# Patient Record
Sex: Female | Born: 1950 | Race: White | Hispanic: No | Marital: Single | Smoking: Never smoker
Health system: Southern US, Community
[De-identification: ages and names within clinical notes are randomized; demographics above are authoritative.]

## PROBLEM LIST (undated history)

## (undated) DIAGNOSIS — I10 Essential (primary) hypertension: Secondary | ICD-10-CM

## (undated) DIAGNOSIS — T7840XA Allergy, unspecified, initial encounter: Secondary | ICD-10-CM

## (undated) DIAGNOSIS — M810 Age-related osteoporosis without current pathological fracture: Secondary | ICD-10-CM

## (undated) HISTORY — DX: Essential (primary) hypertension: I10

## (undated) HISTORY — PX: BUNIONECTOMY: SHX129

## (undated) HISTORY — DX: Allergy, unspecified, initial encounter: T78.40XA

## (undated) HISTORY — PX: TONSILLECTOMY AND ADENOIDECTOMY: SUR1326

## (undated) HISTORY — DX: Age-related osteoporosis without current pathological fracture: M81.0

## (undated) HISTORY — PX: OTHER SURGICAL HISTORY: SHX169

---

## 2018-10-10 ENCOUNTER — Encounter: Payer: Self-pay | Admitting: Family Medicine

## 2018-10-21 ENCOUNTER — Encounter: Payer: Self-pay | Admitting: Family Medicine

## 2018-11-01 ENCOUNTER — Encounter: Payer: Self-pay | Admitting: Family Medicine

## 2018-11-04 ENCOUNTER — Encounter: Payer: Self-pay | Admitting: Family Medicine

## 2018-11-04 ENCOUNTER — Other Ambulatory Visit: Payer: Self-pay

## 2018-11-04 ENCOUNTER — Ambulatory Visit (INDEPENDENT_AMBULATORY_CARE_PROVIDER_SITE_OTHER): Payer: Medicare Other | Admitting: Family Medicine

## 2018-11-04 VITALS — BP 116/72 | HR 67 | Temp 98.3°F | Resp 16 | Ht 65.5 in | Wt 157.0 lb

## 2018-11-04 DIAGNOSIS — Z1239 Encounter for other screening for malignant neoplasm of breast: Secondary | ICD-10-CM

## 2018-11-04 DIAGNOSIS — Z1159 Encounter for screening for other viral diseases: Secondary | ICD-10-CM | POA: Diagnosis not present

## 2018-11-04 DIAGNOSIS — I10 Essential (primary) hypertension: Secondary | ICD-10-CM | POA: Diagnosis not present

## 2018-11-04 DIAGNOSIS — E2839 Other primary ovarian failure: Secondary | ICD-10-CM

## 2018-11-04 HISTORY — DX: Essential (primary) hypertension: I10

## 2018-11-04 LAB — LIPID PANEL
Cholesterol: 190 mg/dL (ref 0–200)
HDL: 39.4 mg/dL (ref >39.00)
LDL Cholesterol: 123 mg/dL — ABNORMAL HIGH (ref 0–99)
NonHDL: 150.68
Total CHOL/HDL Ratio: 5
Triglycerides: 139 mg/dL (ref 0.0–149.0)
VLDL: 27.8 mg/dL (ref 0.0–40.0)

## 2018-11-04 LAB — POCT URINALYSIS DIPSTICK
Bilirubin, UA: NEGATIVE
Blood, UA: NEGATIVE
Glucose, UA: NEGATIVE
Ketones, UA: NEGATIVE
Nitrite, UA: NEGATIVE
Protein, UA: NEGATIVE
Spec Grav, UA: >=1.03 — AB (ref 1.010–1.025)
Urobilinogen, UA: 1 E.U./dL
pH, UA: 5.5 (ref 5.0–8.0)

## 2018-11-04 LAB — COMPREHENSIVE METABOLIC PANEL
ALT: 13 U/L (ref 0–35)
AST: 13 U/L (ref 0–37)
Albumin: 4.3 g/dL (ref 3.5–5.2)
Alkaline Phosphatase: 59 U/L (ref 39–117)
BUN: 13 mg/dL (ref 6–23)
CO2: 28 mEq/L (ref 19–32)
Calcium: 9.7 mg/dL (ref 8.4–10.5)
Chloride: 105 mEq/L (ref 96–112)
Creatinine, Ser: 0.8 mg/dL (ref 0.40–1.20)
GFR: 71.61 mL/min (ref >60.00)
Glucose, Bld: 96 mg/dL (ref 70–99)
Potassium: 4.3 mEq/L (ref 3.5–5.1)
Sodium: 141 mEq/L (ref 135–145)
Total Bilirubin: 0.7 mg/dL (ref 0.2–1.2)
Total Protein: 6.9 g/dL (ref 6.0–8.3)

## 2018-11-04 LAB — CBC WITH DIFFERENTIAL/PLATELET
Basophils Absolute: 0 10*3/uL (ref 0.0–0.1)
Basophils Relative: 0.6 % (ref 0.0–3.0)
Eosinophils Absolute: 0.2 10*3/uL (ref 0.0–0.7)
Eosinophils Relative: 2.6 % (ref 0.0–5.0)
HCT: 41.9 % (ref 36.0–46.0)
Hemoglobin: 13.9 g/dL (ref 12.0–15.0)
Lymphocytes Relative: 34.6 % (ref 12.0–46.0)
Lymphs Abs: 2.5 10*3/uL (ref 0.7–4.0)
MCHC: 33.2 g/dL (ref 30.0–36.0)
MCV: 95.9 fl (ref 78.0–100.0)
Monocytes Absolute: 0.7 10*3/uL (ref 0.1–1.0)
Monocytes Relative: 9.5 % (ref 3.0–12.0)
Neutro Abs: 3.8 10*3/uL (ref 1.4–7.7)
Neutrophils Relative %: 52.7 % (ref 43.0–77.0)
Platelets: 278 10*3/uL (ref 150.0–400.0)
RBC: 4.37 Mil/uL (ref 3.87–5.11)
RDW: 12.8 % (ref 11.5–15.5)
WBC: 7.2 10*3/uL (ref 4.0–10.5)

## 2018-11-04 LAB — TSH: TSH: 3.32 u[IU]/mL (ref 0.35–4.50)

## 2018-11-04 MED ORDER — PREVNAR 13 IM SUSP: 0.5000 mL | Freq: Once | INTRAMUSCULAR | 0 refills | Status: AC

## 2018-11-04 MED ORDER — SHINGRIX 50 MCG/0.5ML IM SUSR: 0.5000 mL | Freq: Once | INTRAMUSCULAR | 0 refills | Status: AC

## 2018-11-04 NOTE — Patient Instructions (Signed)
Please return in 6 months for follow up of your hypertension.  I will release your lab results to you on your MyChart account with further instructions. Please reply with any questions.   Please take the prescriptions to the pharmacy for Shingrix and Prevnar to get vaccinated.   We will call you with information regarding your referral appointment. Mammogram and Bone Density.  If you do not hear from [place] within the next 2 weeks, please let me know. It can take 1-2 weeks to get appointments set up with the specialists.    It was a pleasure meeting you today! Thank you for choosing [place] to meet your healthcare needs! I truly look forward to working with you. If you have any questions or concerns, please send me a message via Mychart or call the office at [phone].   Preventive Care 67 Years and Older, Female Preventive care refers to lifestyle choices and visits with your health care provider that can promote health and wellness. This includes:  A yearly physical exam. This is also called an annual well check.  Regular dental and eye exams.  Immunizations.  Screening for certain conditions.  Healthy lifestyle choices, such as diet and exercise. What can I expect for my preventive care visit? Physical exam Your health care provider will check:  Height and weight. These may be used to calculate body mass index (BMI), which is a measurement that tells if you are at a healthy weight.  Heart rate and blood pressure.  Your skin for abnormal spots. Counseling Your health care provider may ask you questions about:  Alcohol, tobacco, and drug use.  Emotional well-being.  Home and relationship well-being.  Sexual activity.  Eating habits.  History of falls.  Memory and ability to understand (cognition).  Work and work Statistician.  Pregnancy and menstrual history. What immunizations do I need?  Influenza (flu) vaccine  This is recommended every year. Tetanus,  diphtheria, and pertussis (Tdap) vaccine  You may need a Td booster every 10 years. Varicella (chickenpox) vaccine  You may need this vaccine if you have not already been vaccinated. Zoster (shingles) vaccine  You may need this after age 32. Pneumococcal conjugate (PCV13) vaccine  One dose is recommended after age 30. Pneumococcal polysaccharide (PPSV23) vaccine  One dose is recommended after age 7. Measles, mumps, and rubella (MMR) vaccine  You may need at least one dose of MMR if you were born in 1957 or later. You may also need a second dose. Meningococcal conjugate (MenACWY) vaccine  You may need this if you have certain conditions. Hepatitis A vaccine  You may need this if you have certain conditions or if you travel or work in places where you may be exposed to hepatitis A. Hepatitis B vaccine  You may need this if you have certain conditions or if you travel or work in places where you may be exposed to hepatitis B. Haemophilus influenzae type b (Hib) vaccine  You may need this if you have certain conditions. You may receive vaccines as individual doses or as more than one vaccine together in one shot (combination vaccines). Talk with your health care provider about the risks and benefits of combination vaccines. What tests do I need? Blood tests  Lipid and cholesterol levels. These may be checked every 5 years, or more frequently depending on your overall health.  Hepatitis C test.  Hepatitis B test. Screening  Lung cancer screening. You may have this screening every year starting at age 26  if you have a 30-pack-year history of smoking and currently smoke or have quit within the past 15 years.  Colorectal cancer screening. All adults should have this screening starting at age 107 and continuing until age 45. Your health care provider may recommend screening at age 61 if you are at increased risk. You will have tests every 1-10 years, depending on your results and  the type of screening test.  Diabetes screening. This is done by checking your blood sugar (glucose) after you have not eaten for a while (fasting). You may have this done every 1-3 years.  Mammogram. This may be done every 1-2 years. Talk with your health care provider about how often you should have regular mammograms.  BRCA-related cancer screening. This may be done if you have a family history of breast, ovarian, tubal, or peritoneal cancers. Other tests  Sexually transmitted disease (STD) testing.  Bone density scan. This is done to screen for osteoporosis. You may have this done starting at age 74. Follow these instructions at home: Eating and drinking  Eat a diet that includes fresh fruits and vegetables, whole grains, lean protein, and low-fat dairy products. Limit your intake of foods with high amounts of sugar, saturated fats, and salt.  Take vitamin and mineral supplements as recommended by your health care provider.  Do not drink alcohol if your health care provider tells you not to drink.  If you drink alcohol: ? Limit how much you have to 0-1 drink a day. ? Be aware of how much alcohol is in your drink. In the [place] one drink equals one 12 oz bottle of beer (355 mL), one 5 oz glass of wine (148 mL), or one 1 oz glass of hard liquor (44 mL). Lifestyle  Take daily care of your teeth and gums.  Stay active. Exercise for at least 30 minutes on 5 or more days each week.  Do not use any products that contain nicotine or tobacco, such as cigarettes, e-cigarettes, and chewing tobacco. If you need help quitting, ask your health care provider.  If you are sexually active, practice safe sex. Use a condom or other form of protection in order to prevent STIs (sexually transmitted infections).  Talk with your health care provider about taking a low-dose aspirin or statin. What's next?  Go to your health care provider once a year for a well check visit.  Ask your health care  provider how often you should have your eyes and teeth checked.  Stay up to date on all vaccines. This information is not intended to replace advice given to you by your health care provider. Make sure you discuss any questions you have with your health care provider. Document Released: 04/23/2015 Document Revised: 03/21/2018 Document Reviewed: 03/21/2018 Elsevier Patient Education  2020 [employer].

## 2018-11-04 NOTE — Progress Notes (Signed)
Subjective  CC:  Chief Complaint  Patient presents with  . Establish Care    Recent move to the area, PCP was Dr. Sanjuan Dame.   . Hypertension    Takes Amlodipine and Lisinopril, reports no problems    HPI: Stephanie Powell is a 68 y.o. female who presents to [facility] [place] today to establish care with me as a new patient.  Very healthy pleasant divorced 68 yo female who moved here in nov 2019 from [place] area. Doing well. Her sister lives here and they are close. Healthy lifestyle.  She has the following concerns or needs:  Last CPE: > 1 year. Due mammogram and dexa. Due shingrix and pneuomococcal series.   Colon polyp on last colonoscopy; prefers to defer f/u until next year.   HTN for 12 months controlled on low dose amlodipine and ace. Feeling well. Taking medications w/o adverse effects. No symptoms of CHF, angina; no palpitations, sob, cp or lower extremity edema. Compliant with meds.   No other medical problems or concerns.   Assessment  1. Essential hypertension   2. Need for hepatitis C screening test   3. Breast cancer screening   4. Hypoestrogenism      Plan   HTN: doing very well on low dose meds. Prefers to leave meds as are. Continue same.   HM: schedule mammo and dexa. RXs for prevnar and Shingrix given. Education given.   Follow up:  Return in about 6 months (around 05/07/2019) for follow up Hypertension. Orders Placed This Encounter  Procedures  . MM DIGITAL SCREENING BILATERAL  . DG Bone Density  . CBC with Differential/Platelet  . Comprehensive metabolic panel  . Lipid panel  . TSH  . Hepatitis C antibody  . POCT urinalysis dipstick   Meds ordered this encounter  Medications  . Zoster Vaccine Adjuvanted [facility]) injection    Sig: Inject 0.5 mLs into the muscle once for 1 dose. Please give 2nd dose 2-6 months after first dose    Dispense:  2 each    Refill:  0  . pneumococcal 13-valent conjugate vaccine (PREVNAR  13) SUSP injection    Sig: Inject 0.5 mLs into the muscle once for 1 dose.    Dispense:  0.5 mL    Refill:  0     Depression screen [facility] 2/9 11/04/2018  Decreased Interest 0  Down, Depressed, Hopeless 0  PHQ - 2 Score 0    We updated and reviewed the patient's past history in detail and it is documented below.  Patient Active Problem List   Diagnosis Date Noted  . Essential hypertension 11/04/2018   Health Maintenance  Topic Date Due  . Hepatitis C Screening  07/19/1950  . TETANUS/TDAP  02/27/1971  . DEXA SCAN  02/26/2017  . PNA vac Low Risk Adult (1 of 2 - PCV13) 02/26/2017  . MAMMOGRAM  11/08/2017  . INFLUENZA VACCINE  11/09/2018  . COLONOSCOPY  03/09/2024   Immunization History  Administered Date(s) Administered  . Influenza-Unspecified 01/13/2018   Current Meds  Medication Sig  . amLODipine (NORVASC) 5 MG tablet Take 1 tablet by mouth daily.  Marland Kitchen lisinopril (ZESTRIL) 5 MG tablet Take 1 tablet by mouth daily.    Allergies: Patient is allergic to sulfa antibiotics. Past Medical History Patient  has a past medical history of Essential hypertension (11/04/2018) and Hypertension. Past Surgical History Patient  has no past surgical history on file. Family History: Patient family history includes Heart attack  in her father; High Cholesterol in her mother; High blood pressure in her mother, sister, and sister; Stroke in her mother. Social History:  Patient  reports that she has never smoked. She has never used smokeless tobacco. She reports current alcohol use.  Review of Systems: Constitutional: negative for fever or malaise Ophthalmic: negative for photophobia, double vision or loss of vision Cardiovascular: negative for chest pain, dyspnea on exertion, or new LE swelling Respiratory: negative for SOB or persistent cough Gastrointestinal: negative for abdominal pain, change in bowel habits or melena Genitourinary: negative for dysuria or gross hematuria  Musculoskeletal: negative for new gait disturbance or muscular weakness Integumentary: negative for new or persistent rashes Neurological: negative for TIA or stroke symptoms Psychiatric: negative for SI or delusions Allergic/Immunologic: negative for hives  Patient Care Team    Relationship Specialty Notifications Start End  Leamon Arnt, MD PCP - General Family Medicine  11/04/18     Objective  Vitals: BP 116/72   Pulse 67   Temp 98.3 F (36.8 C) (Oral)   Resp 16   Ht 5' 5.5" (1.664 m)   Wt 157 lb (71.2 kg)   SpO2 97%   BMI 25.73 kg/m  General:  Well developed, well nourished, no acute distress  Psych:  Alert and oriented,normal mood and affect HEENT:  Normocephalic, atraumatic, non-icteric sclera, PERRL, oropharynx is without mass or exudate, supple neck without adenopathy, mass or thyromegaly Cardiovascular:  RRR without gallop, rub or murmur, nondisplaced PMI Respiratory:  Good breath sounds bilaterally, CTAB with normal respiratory effort Gastrointestinal: normal bowel sounds, soft, non-tender, no noted masses. No HSM MSK: no deformities, contusions. Joints are without erythema or swelling Skin:  Warm, no rashes or suspicious lesions noted Neurologic:    Mental status is normal. Gross motor and sensory exams are normal. Normal gait Breasts: normal bilaterally. No masses, or adenopathy.   Commons side effects, risks, benefits, and alternatives for medications and treatment plan prescribed today were discussed, and the patient expressed understanding of the given instructions. Patient is instructed to call or message via MyChart if he/she has any questions or concerns regarding our treatment plan. No barriers to understanding were identified. We discussed Red Flag symptoms and signs in detail. Patient expressed understanding regarding what to do in case of urgent or emergency type symptoms.   Medication list was reconciled, printed and provided to the patient in AVS. Patient  instructions and summary information was reviewed with the patient as documented in the AVS. This note was prepared with assistance of Dragon voice recognition software. Occasional wrong-word or sound-a-like substitutions may have occurred due to the inherent limitations of voice recognition software

## 2018-11-05 LAB — HEPATITIS C ANTIBODY
Hepatitis C Ab: NONREACTIVE
SIGNAL TO CUT-OFF: 0.01 (ref <1.00)

## 2018-11-06 ENCOUNTER — Other Ambulatory Visit: Payer: Self-pay | Admitting: Family Medicine

## 2018-11-12 ENCOUNTER — Telehealth: Payer: Self-pay | Admitting: Family Medicine

## 2018-11-12 NOTE — Telephone Encounter (Signed)
See note  Copied from [place] [id number]. Topic: General - Inquiry >> Nov 12, 2018  1:59 PM Virl Axe D wrote: Reason for CRM: Pt called to inquire if office has received her medical records from previous PCP office. She would like to know if records will be able to tell if she has had her pneumonia shot. Requesting call back. Please advise.

## 2018-11-12 NOTE — Telephone Encounter (Signed)
Called and spoke with pt

## 2018-12-30 ENCOUNTER — Other Ambulatory Visit: Payer: Self-pay

## 2018-12-30 ENCOUNTER — Ambulatory Visit
Admission: RE | Admit: 2018-12-30 | Discharge: 2018-12-30 | Disposition: A | Discharge status: Home / Self Care | Payer: Medicare Other | Source: Ambulatory Visit | Attending: Family Medicine | Admitting: Family Medicine

## 2018-12-30 DIAGNOSIS — Z1239 Encounter for other screening for malignant neoplasm of breast: Secondary | ICD-10-CM

## 2019-01-14 ENCOUNTER — Other Ambulatory Visit: Payer: Medicare Other

## 2019-01-14 ENCOUNTER — Ambulatory Visit: Payer: Medicare Other

## 2019-02-10 ENCOUNTER — Encounter: Payer: Self-pay | Admitting: Family Medicine

## 2019-02-10 MED ORDER — LISINOPRIL 5 MG PO TABS: 5.0000 mg | ORAL_TABLET | Freq: Every day | ORAL | 3 refills | Status: DC

## 2019-03-24 ENCOUNTER — Telehealth: Payer: Self-pay | Admitting: Family Medicine

## 2019-03-24 NOTE — Telephone Encounter (Signed)
I left a message asking the patient to call and schedule Medicare AWV with Courtney ([facility]).  If patient calls back, please schedule Medicare Wellness Visit (initial) at next available opening.  VDM (Dee-Dee) 

## 2019-04-11 LAB — HM DEXA SCAN

## 2019-05-06 ENCOUNTER — Other Ambulatory Visit: Payer: Self-pay

## 2019-05-07 ENCOUNTER — Encounter: Payer: Self-pay | Admitting: Family Medicine

## 2019-05-07 ENCOUNTER — Ambulatory Visit (INDEPENDENT_AMBULATORY_CARE_PROVIDER_SITE_OTHER): Payer: Medicare Other | Admitting: Family Medicine

## 2019-05-07 VITALS — BP 122/64 | HR 66 | Temp 97.7°F | Ht 65.5 in | Wt 162.4 lb

## 2019-05-07 DIAGNOSIS — I10 Essential (primary) hypertension: Secondary | ICD-10-CM

## 2019-05-07 NOTE — Progress Notes (Signed)
Subjective  CC:  Chief Complaint  Patient presents with  . Hypertension    taking meds daily none missed. does not check bp at home.     HPI: Stephanie Powell is a 69 y.o. female who presents to the office today to address the problems listed above in the chief complaint.  Hypertension f/u: Control is good . Pt reports she is doing well. taking medications as instructed, no medication side effects noted, no TIAs, no chest pain on exertion, no dyspnea on exertion, no swelling of ankles. She has been on meds for about 2 years but would like to see if she can come off of them.  She denies adverse effects from his BP medications. Compliance with medication is good.   Assessment  1. Essential hypertension      Plan    Hypertension f/u: BP control is well controlled. Will stop lisinopril and follow bp. Discussed appropriate expectations. Recheck if elevating.   HM: will get mammo and dexa in September.  Education regarding management of these chronic disease states was given. Management strategies discussed on successive visits include dietary and exercise recommendations, goals of achieving and maintaining IBW, and lifestyle modifications aiming for adequate sleep and minimizing stressors.   Follow up: July 2021 for cpe and f/u htn  No orders of the defined types were placed in this encounter.  No orders of the defined types were placed in this encounter.     BP Readings from Last 3 Encounters:  05/07/19 122/64  11/04/18 116/72   Wt Readings from Last 3 Encounters:  05/07/19 162 lb 6.4 oz (73.7 kg)  11/04/18 157 lb (71.2 kg)    Lab Results  Component Value Date   CHOL 190 11/04/2018   Lab Results  Component Value Date   HDL 39.40 11/04/2018   Lab Results  Component Value Date   LDLCALC 123 (H) 11/04/2018   Lab Results  Component Value Date   TRIG 139.0 11/04/2018   Lab Results  Component Value Date   CHOLHDL 5 11/04/2018   No results found for:  LDLDIRECT Lab Results  Component Value Date   CREATININE 0.80 11/04/2018   BUN 13 11/04/2018   NA 141 11/04/2018   K 4.3 11/04/2018   CL 105 11/04/2018   CO2 28 11/04/2018    The 10-year ASCVD risk score Mikey Bussing DC Jr., et al., 2013) is: 9.1%   Values used to calculate the score:     Age: 78 years     Sex: Female     Is Non-Hispanic African American: No     Diabetic: No     Tobacco smoker: No     Systolic Blood Pressure: 123XX123 mmHg     Is BP treated: Yes     HDL Cholesterol: 39.4 mg/dL     Total Cholesterol: 190 mg/dL  I reviewed the patients updated PMH, FH, and SocHx.    Patient Active Problem List   Diagnosis Date Noted  . Essential hypertension 11/04/2018    Allergies: Sulfa antibiotics  Social History: Patient  reports that she has never smoked. She has never used smokeless tobacco. She reports current alcohol use.  Current Meds  Medication Sig  . amLODipine (NORVASC) 5 MG tablet TAKE 1 TABLET BY MOUTH EVERY DAY  . [DISCONTINUED] lisinopril (ZESTRIL) 5 MG tablet Take 1 tablet (5 mg total) by mouth daily.    Review of Systems: Cardiovascular: negative for chest pain, palpitations, leg swelling, orthopnea Respiratory: negative for SOB, wheezing  or persistent cough Gastrointestinal: negative for abdominal pain Genitourinary: negative for dysuria or gross hematuria  Objective  Vitals: BP 122/64 (BP Location: Right Arm, Patient Position: Sitting, Cuff Size: Normal)   Pulse 66   Temp 97.7 F (36.5 C) (Temporal)   Ht 5' 5.5" (1.664 m)   Wt 162 lb 6.4 oz (73.7 kg)   SpO2 98%   BMI 26.61 kg/m  General: no acute distress  Psych:  Alert and oriented, normal mood and affect HEENT:  Normocephalic, atraumatic, supple neck  Cardiovascular:  RRR without murmur. no edema Respiratory:  Good breath sounds bilaterally, CTAB with normal respiratory effort   Commons side effects, risks, benefits, and alternatives for medications and treatment plan prescribed today were  discussed, and the patient expressed understanding of the given instructions. Patient is instructed to call or message via MyChart if he/she has any questions or concerns regarding our treatment plan. No barriers to understanding were identified. We discussed Red Flag symptoms and signs in detail. Patient expressed understanding regarding what to do in case of urgent or emergency type symptoms.   Medication list was reconciled, printed and provided to the patient in AVS. Patient instructions and summary information was reviewed with the patient as documented in the AVS. This note was prepared with assistance of Dragon voice recognition software. Occasional wrong-word or sound-a-like substitutions may have occurred due to the inherent limitations of voice recognition software  This visit occurred during the SARS-CoV-2 public health emergency.  Safety protocols were in place, including screening questions prior to the visit, additional usage of staff PPE, and extensive cleaning of exam room while observing appropriate contact time as indicated for disinfecting solutions.

## 2019-05-07 NOTE — Patient Instructions (Signed)
Please return in July 2021 for your annual complete physical; please come fasting.  Stop the lisinopril as instructed and monitor your blood pressure starting in the next 2-3 weeks. Send me a message if it elevated.   If you have any questions or concerns, please don't hesitate to send me a message via MyChart or call the office at [phone]. Thank you for visiting with [place] today! It's our pleasure caring for you.

## 2019-05-09 ENCOUNTER — Ambulatory Visit: Payer: Medicare Other

## 2019-05-13 ENCOUNTER — Encounter: Payer: Self-pay | Admitting: Family Medicine

## 2019-05-17 ENCOUNTER — Ambulatory Visit: Payer: Medicare Other

## 2019-05-26 ENCOUNTER — Ambulatory Visit: Payer: Medicare Other

## 2019-07-18 ENCOUNTER — Encounter: Payer: Self-pay | Admitting: Family Medicine

## 2019-07-27 ENCOUNTER — Other Ambulatory Visit: Payer: Self-pay | Admitting: Family Medicine

## 2019-11-12 ENCOUNTER — Other Ambulatory Visit: Payer: Self-pay

## 2019-11-12 ENCOUNTER — Encounter: Payer: Self-pay | Admitting: Family Medicine

## 2019-11-12 ENCOUNTER — Ambulatory Visit (INDEPENDENT_AMBULATORY_CARE_PROVIDER_SITE_OTHER): Payer: Medicare Other | Admitting: Family Medicine

## 2019-11-12 VITALS — BP 118/86 | HR 70 | Temp 98.0°F | Resp 15 | Ht 66.0 in | Wt 159.8 lb

## 2019-11-12 DIAGNOSIS — I10 Essential (primary) hypertension: Secondary | ICD-10-CM

## 2019-11-12 DIAGNOSIS — K635 Polyp of colon: Secondary | ICD-10-CM

## 2019-11-12 DIAGNOSIS — Z1231 Encounter for screening mammogram for malignant neoplasm of breast: Secondary | ICD-10-CM

## 2019-11-12 DIAGNOSIS — M8580 Other specified disorders of bone density and structure, unspecified site: Secondary | ICD-10-CM

## 2019-11-12 DIAGNOSIS — Z780 Asymptomatic menopausal state: Secondary | ICD-10-CM

## 2019-11-12 HISTORY — DX: Polyp of colon: K63.5

## 2019-11-12 HISTORY — DX: Other specified disorders of bone density and structure, unspecified site: M85.80

## 2019-11-12 HISTORY — DX: Asymptomatic menopausal state: Z78.0

## 2019-11-12 MED ORDER — PREVNAR 13 IM SUSP: 0.5000 mL | Freq: Once | INTRAMUSCULAR | 0 refills | Status: AC

## 2019-11-12 NOTE — Progress Notes (Signed)
Subjective  Chief Complaint  Patient presents with  . Annual Exam    non-fasting    HPI: Stephanie Powell is a 69 y.o. female who presents to [facility] today for a Female Wellness Visit. She also has the concerns and/or needs as listed above in the chief complaint. These will be addressed in addition to the Health Maintenance Visit.   Wellness Visit: annual visit with health maintenance review and exam without Pap   Health maintenance: Mammogram due in September.  Bone density follow-up due in September.  Last done in [place] with a diagnosis of osteopenia, lowest T score equals -1.8.  Patient does not take vitamin supplements but does try to get calcium and vitamin D in her diet.  Eats a healthy diet.  Limited exercise.  Feeling well overall  Immunizations: [facility] called and reported a Pneumovax in August 2020.  Cannot find record of Prevnar but patient thinks she has had it.  She will look into her records. Chronic disease f/u and/or acute problem visit: (deemed necessary to be done in addition to the wellness visit):  History of hypertension: Patient now taking 2.5 mg lisinopril daily and 2.5 mg amlodipine daily.  Home blood pressure readings have been normal.  She would like to come off medications and monitor home blood pressure readings if possible.  No history of coronary artery disease, no chest pain, shortness of breath, lower extremity edema.  Eats a low-salt diet.  At time of diagnosis of hypertension, had high stress lifestyle.  Feels that maybe things have corrected.  She will be willing to restart medications if indicated.  History of colonic polyps by last colonoscopy in 2015.  Due for follow-up.  Patient ready to get records to GI.  Ready for referral.  No melena abdominal pain no weight loss  Assessment  1. Essential hypertension   2. Osteopenia after menopause   3. Polyp of colon, unspecified part of colon, unspecified type   4. Encounter  for screening mammogram for breast cancer   5. Asymptomatic menopausal state       Plan  Female Wellness Visit:  Age appropriate Health Maintenance and Prevention measures were discussed with patient. Included topics are cancer screening recommendations, ways to keep healthy (see AVS) including dietary and exercise recommendations, regular eye and dental care, use of seat belts, and avoidance of moderate alcohol use and tobacco use.   BMI: discussed patient's BMI and encouraged positive lifestyle modifications to help get to or maintain a target BMI.  HM needs and immunizations were addressed and ordered. See below for orders. See HM and immunization section for updates.  Prescription for Prevnar given to be gotten at pharmacy if needed  Routine labs and screening tests ordered including cmp, cbc and lipids where appropriate.  Discussed recommendations regarding Vit D and calcium supplementation (see AVS)  Chronic disease management visit and/or acute problem visit:  Essential hypertension: Good control on very low dose ACE and amlodipine.  Stop both medications.  Monitor home blood pressure readings over the next month daily.  Send in readings to me.  If elevated, will restart one medication.  Osteopenia: Follow-up bone density ordered.  Patient to try to get calcium and vitamin D by diet.  See after visit summary for instructions.  She would like to avoid any oral supplements.  Colon polyps: Refer for colonoscopy  Follow up: Return in about 6 months (around 05/14/2020) for follow up Hypertension.  Orders Placed This Encounter    Procedures  . MM DIGITAL SCREENING BILATERAL  . DG Bone Density  . CBC with Differential/Platelet  . Comp Met (CMET)  . Lipid Profile  . Ambulatory referral to Gastroenterology   Meds ordered this encounter  Medications  . pneumococcal 13-valent conjugate vaccine (PREVNAR 13) SUSP injection    Sig: Inject 0.5 mLs into the muscle once for 1 dose.     Dispense:  0.5 mL    Refill:  0      Lifestyle: Body mass index is 25.79 kg/m. Wt Readings from Last 3 Encounters:  11/12/19 159 lb 12.8 oz (72.5 kg)  05/07/19 162 lb 6.4 oz (73.7 kg)  11/04/18 157 lb (71.2 kg)    Patient Active Problem List   Diagnosis Date Noted  . Osteopenia after menopause 11/12/2019    [facility] DEXA 2020: T = -1.8 lowest   . Colon polyps 11/12/2019    Colonoscopy, [place]: 2015   . Essential hypertension 11/04/2018   Health Maintenance  Topic Date Due  . INFLUENZA VACCINE  11/09/2019  . TETANUS/TDAP  05/06/2020 (Originally 02/27/1970)  . PNA vac Low Risk Adult (2 of 2 - PCV13) 12/04/2019  . MAMMOGRAM  12/30/2019  . DEXA SCAN  04/10/2021  . COLONOSCOPY  03/09/2024  . COVID-19 Vaccine  Completed  . Hepatitis C Screening  Completed   Immunization History  Administered Date(s) Administered  . Fluad Quad(high Dose 65+) 12/04/2018  . Influenza-Unspecified 01/13/2018  . Moderna SARS-COVID-2 Vaccination 05/13/2019, 06/12/2019  . Pneumococcal Polysaccharide-23 12/04/2018   We updated and reviewed the patient's past history in detail and it is documented below. Allergies: Patient is allergic to sulfa antibiotics. Past Medical History Patient  has a past medical history of Colon polyps (11/12/2019), Essential hypertension (11/04/2018), Hypertension, and Osteopenia after menopause (11/12/2019). Past Surgical History Patient  has no past surgical history on file. Family History: Patient family history includes Heart attack in her father; High Cholesterol in her mother; High blood pressure in her mother, sister, and sister; Stroke in her mother. Social History:  Patient  reports that she has never smoked. She has never used smokeless tobacco. She reports current alcohol use.  Review of Systems: Constitutional: negative for fever or malaise Ophthalmic: negative for photophobia, double vision or loss of vision Cardiovascular: negative for chest pain, dyspnea on  exertion, or new LE swelling Respiratory: negative for SOB or persistent cough Gastrointestinal: negative for abdominal pain, change in bowel habits or melena Genitourinary: negative for dysuria or gross hematuria, no abnormal uterine bleeding or disharge Musculoskeletal: negative for new gait disturbance or muscular weakness Integumentary: negative for new or persistent rashes, no breast lumps Neurological: negative for TIA or stroke symptoms Psychiatric: negative for SI or delusions Allergic/Immunologic: negative for hives  Patient Care Team    Relationship Specialty Notifications Start End  Leamon Arnt, MD PCP - General Family Medicine  11/04/18     Objective  Vitals: BP 118/86   Pulse 70   Temp 98 F (36.7 C) (Temporal)   Resp 15   Ht 5' 6" (1.676 m)   Wt 159 lb 12.8 oz (72.5 kg)   SpO2 98%   BMI 25.79 kg/m  General:  Well developed, well nourished, no acute distress  Psych:  Alert and orientedx3,normal mood and affect HEENT:  Normocephalic, atraumatic, non-icteric sclera,  supple neck without adenopathy, mass or thyromegaly Cardiovascular:  Normal S1, S2, RRR without gallop, rub or murmur Respiratory:  Good breath sounds bilaterally, CTAB with normal respiratory effort Gastrointestinal: normal bowel  sounds, soft, non-tender, no noted masses. No HSM MSK: no deformities, contusions. Joints are without erythema or swelling.  Skin:  Warm, no rashes or suspicious lesions noted Neurologic:    Mental status is normal. CN 2-11 are normal. Gross motor and sensory exams are normal. Normal gait. No tremor Breast Exam: No mass, skin retraction or nipple discharge is appreciated in either breast. No axillary adenopathy. Fibrocystic changes are not noted    Commons side effects, risks, benefits, and alternatives for medications and treatment plan prescribed today were discussed, and the patient expressed understanding of the given instructions. Patient is instructed to call or  message via MyChart if he/she has any questions or concerns regarding our treatment plan. No barriers to understanding were identified. We discussed Red Flag symptoms and signs in detail. Patient expressed understanding regarding what to do in case of urgent or emergency type symptoms.   Medication list was reconciled, printed and provided to the patient in AVS. Patient instructions and summary information was reviewed with the patient as documented in the AVS. This note was prepared with assistance of Dragon voice recognition software. Occasional wrong-word or sound-a-like substitutions may have occurred due to the inherent limitations of voice recognition software  This visit occurred during the SARS-CoV-2 public health emergency.  Safety protocols were in place, including screening questions prior to the visit, additional usage of staff PPE, and extensive cleaning of exam room while observing appropriate contact time as indicated for disinfecting solutions.     

## 2019-11-12 NOTE — Patient Instructions (Addendum)
Please return in 6 months for hypertension follow up.  I will release your lab results to you on your MyChart account with further instructions. Please reply with any questions.   Please take the prescription for Shingrix and Prevnar to the pharmacy so they may administer the vaccinations. Your insurance will then cover the injections.   If you have any questions or concerns, please don't hesitate to send me a message via MyChart or call the office at [phone]. Thank you for visiting with [place] today! It's our pleasure caring for you.  I have ordered a mammogram and/or bone density for you as we discussed today: both are due in September [x]   Mammogram  [x]   Bone Density  Please call the office checked below to schedule your appointment: Your appointment will at the following location  [x]   [facility] [facility] [place]      [place], [place]         []   [facility]  [place], [place]  Make sure to wear two peace clothing  No lotions powders or deodorants the day of the appointment Make sure to bring picture ID and insurance card.  Bring list of medications you are currently taking including any supplements.    Calcium Intake Recommendations You can take Caltrate Plus twice a day or get it through your diet or other OTC supplements (Viactiv, OsCal etc)  Calcium is a mineral that affects many functions in the body, including:  Blood clotting.  Blood vessel function.  Nerve impulse conduction.  Hormone secretion.  Muscle contraction.  Bone and teeth functions.  Most of your body's calcium supply is stored in your bones and teeth. When your calcium stores are low, you may be at risk for low bone mass, bone loss, and bone fractures. Consuming enough calcium helps to grow healthy bones and teeth and to prevent breakdown over time. It is very important that you get enough calcium if  you are:  A child undergoing rapid growth.  An adolescent girl.  A pre- or post-menopausal woman.  A woman whose menstrual cycle has stopped due to anorexia nervosa or regular intense exercise.  An individual with lactose intolerance or a milk allergy.  A vegetarian.  What is my plan? Try to consume the recommended amount of calcium daily based on your age. Depending on your overall health, your health care provider may recommend increased calcium intake.General daily calcium intake recommendations by age are:  Birth to 6 months: 200 mg.  Infants 7 to 12 months: 260 mg.  Children 1 to 3 years: 700 mg.  Children 4 to 8 years: 1,000 mg.  Children 9 to 13 years: 1,300 mg.  Teens 14 to 18 years: 1,300 mg.  Adults 19 to 50 years: 1,000 mg.  Adult women 51 to 70 years: 1,200 mg.  Adult men 51 to 70 years: 1,000 mg.  Adults 71 years and older: 1,200 mg.  Pregnant and breastfeeding teens: 1,300 mg.  Pregnant and breastfeeding adults: 1,000 mg.  What do I need to know about calcium intake?  In order for the body to absorb calcium, it needs vitamin D. You can get vitamin D through (we recommend getting [phone] units of Vitamin D daily) ? Direct exposure of the skin to sunlight. ? Foods, such as egg yolks, liver, saltwater fish, and fortified milk. ?  Supplements.  Consuming too much calcium may cause: ? Constipation. ? Decreased absorption of iron and zinc. ? Kidney stones.  Calcium supplements may interact with certain medicines. Check with your health care provider before starting any calcium supplements.  Try to get most of your calcium from food. What foods can I eat? Grains  Fortified oatmeal. Fortified ready-to-eat cereals. Fortified frozen waffles. Vegetables Turnip greens. Broccoli. Fruits Fortified orange juice. Meats and Other Protein Sources Canned sardines with bones. Canned salmon with bones. Soy beans. Tofu. Baked beans. Almonds. [place] nuts.  Sunflower seeds. Dairy Milk. Yogurt. Cheese. Cottage cheese. Beverages Fortified soy milk. Fortified rice milk. Sweets/Desserts Pudding. Ice Cream. Milkshakes. Blackstrap molasses. The items listed above may not be a complete list of recommended foods or beverages. Contact your dietitian for more options. What foods can affect my calcium intake? It may be more difficult for your body to use calcium or calcium may leave your body more quickly if you consume large amounts of:  Sodium.  Protein.  Caffeine.  Alcohol.  This information is not intended to replace advice given to you by your health care provider. Make sure you discuss any questions you have with your health care provider. Document Released: 11/09/2003 Document Revised: 10/15/2015 Document Reviewed: 09/02/2013 Elsevier Interactive Patient Education  2018 [employer].

## 2019-11-13 LAB — COMPREHENSIVE METABOLIC PANEL
AG Ratio: 1.4 (calc) (ref 1.0–2.5)
ALT: 21 U/L (ref 6–29)
AST: 18 U/L (ref 10–35)
Albumin: 3.9 g/dL (ref 3.6–5.1)
Alkaline phosphatase (APISO): 56 U/L (ref 37–153)
BUN: 16 mg/dL (ref 7–25)
CO2: 27 mmol/L (ref 20–32)
Calcium: 9.6 mg/dL (ref 8.6–10.4)
Chloride: 104 mmol/L (ref 98–110)
Creat: 0.76 mg/dL (ref 0.50–0.99)
Globulin: 2.8 g/dL (calc) (ref 1.9–3.7)
Glucose, Bld: 100 mg/dL — ABNORMAL HIGH (ref 65–99)
Potassium: 4.3 mmol/L (ref 3.5–5.3)
Sodium: 139 mmol/L (ref 135–146)
Total Bilirubin: 0.6 mg/dL (ref 0.2–1.2)
Total Protein: 6.7 g/dL (ref 6.1–8.1)

## 2019-11-13 LAB — LIPID PANEL
Cholesterol: 228 mg/dL — ABNORMAL HIGH (ref <200)
HDL: 40 mg/dL — ABNORMAL LOW (ref >=50)
LDL Cholesterol (Calc): 154 mg/dL (calc) — ABNORMAL HIGH
Non-HDL Cholesterol (Calc): 188 mg/dL (calc) — ABNORMAL HIGH (ref <130)
Total CHOL/HDL Ratio: 5.7 (calc) — ABNORMAL HIGH (ref <5.0)
Triglycerides: 204 mg/dL — ABNORMAL HIGH (ref <150)

## 2019-11-13 LAB — CBC WITH DIFFERENTIAL/PLATELET
Absolute Monocytes: 582 cells/uL (ref 200–950)
Basophils Absolute: 51 cells/uL (ref 0–200)
Basophils Relative: 0.8 %
Eosinophils Absolute: 211 cells/uL (ref 15–500)
Eosinophils Relative: 3.3 %
HCT: 40.5 % (ref 35.0–45.0)
Hemoglobin: 13.4 g/dL (ref 11.7–15.5)
Lymphs Abs: 2266 cells/uL (ref 850–3900)
MCH: 31.9 pg (ref 27.0–33.0)
MCHC: 33.1 g/dL (ref 32.0–36.0)
MCV: 96.4 fL (ref 80.0–100.0)
MPV: 10.7 fL (ref 7.5–12.5)
Monocytes Relative: 9.1 %
Neutro Abs: 3290 cells/uL (ref 1500–7800)
Neutrophils Relative %: 51.4 %
Platelets: 320 10*3/uL (ref 140–400)
RBC: 4.2 10*6/uL (ref 3.80–5.10)
RDW: 12.2 % (ref 11.0–15.0)
Total Lymphocyte: 35.4 %
WBC: 6.4 10*3/uL (ref 3.8–10.8)

## 2019-11-14 ENCOUNTER — Telehealth: Payer: Self-pay | Admitting: Gastroenterology

## 2019-11-14 NOTE — Telephone Encounter (Signed)
Patient brought in colonoscopy/path report. Dr. Bryan Lemma is doc of the day for 08/04/21am. Records placed on his desk for review.

## 2019-11-20 NOTE — Telephone Encounter (Signed)
Left message for patient to callback to schedule a colonoscopy.

## 2019-11-20 NOTE — Telephone Encounter (Signed)
Records received and reviewed and for the following: -Colonoscopy (Dr. Tama Gander, [facility], 03/02/2014): 2 mm tubular adenoma.  Sigmoid diverticulosis, internal hemorrhoids.  Recommended repeat in 5 years  Based on the finding of a single tubular adenoma, recommendations by the previous Endoscopist, and guideline recommendations at that time, I agree with scheduling repeat colonoscopy now for ongoing polyp surveillance.  Okay to schedule with me.  Thank you.

## 2019-11-21 ENCOUNTER — Encounter: Payer: Self-pay | Admitting: Gastroenterology

## 2019-11-21 NOTE — Telephone Encounter (Signed)
Colon scheduled on 10/1 at 9:30am at [facility].

## 2019-12-16 ENCOUNTER — Encounter: Payer: Self-pay | Admitting: Family Medicine

## 2019-12-23 ENCOUNTER — Ambulatory Visit (AMBULATORY_SURGERY_CENTER): Payer: Self-pay

## 2019-12-23 ENCOUNTER — Other Ambulatory Visit: Payer: Self-pay

## 2019-12-23 VITALS — Ht 66.0 in | Wt 158.0 lb

## 2019-12-23 DIAGNOSIS — Z86010 Personal history of colonic polyps: Secondary | ICD-10-CM

## 2019-12-23 DIAGNOSIS — Z1211 Encounter for screening for malignant neoplasm of colon: Secondary | ICD-10-CM

## 2019-12-23 NOTE — Progress Notes (Signed)
No allergies to soy or egg Pt is not on blood thinners or diet pills Denies issues with sedation/intubation Denies atrial flutter/fib Denies constipation    Pt is aware of Covid safety and care partner requirements.  Patient has an issue with a care partner.  Wants to proceed with PV, but may decide to cancel. Pt was reassured of [facility] protocol and that she can opt to be a 'HIPAA' designated pt. She voiced understanding.

## 2020-01-09 ENCOUNTER — Other Ambulatory Visit: Payer: Self-pay

## 2020-01-09 ENCOUNTER — Ambulatory Visit (AMBULATORY_SURGERY_CENTER): Payer: Medicare Other | Admitting: Gastroenterology

## 2020-01-09 ENCOUNTER — Encounter: Payer: Self-pay | Admitting: Gastroenterology

## 2020-01-09 VITALS — BP 122/98 | HR 62 | Temp 96.9°F | Resp 13 | Ht 66.0 in | Wt 158.0 lb

## 2020-01-09 DIAGNOSIS — K5730 Diverticulosis of large intestine without perforation or abscess without bleeding: Secondary | ICD-10-CM

## 2020-01-09 DIAGNOSIS — D122 Benign neoplasm of ascending colon: Secondary | ICD-10-CM

## 2020-01-09 DIAGNOSIS — K635 Polyp of colon: Secondary | ICD-10-CM

## 2020-01-09 DIAGNOSIS — D123 Benign neoplasm of transverse colon: Secondary | ICD-10-CM

## 2020-01-09 DIAGNOSIS — D120 Benign neoplasm of cecum: Secondary | ICD-10-CM

## 2020-01-09 DIAGNOSIS — Z86010 Personal history of colonic polyps: Secondary | ICD-10-CM | POA: Diagnosis present

## 2020-01-09 DIAGNOSIS — D1801 Hemangioma of skin and subcutaneous tissue: Secondary | ICD-10-CM | POA: Diagnosis not present

## 2020-01-09 DIAGNOSIS — K641 Second degree hemorrhoids: Secondary | ICD-10-CM

## 2020-01-09 MED ORDER — SODIUM CHLORIDE 0.9 % IV SOLN: 500.0000 mL | Freq: Once | INTRAVENOUS | Status: DC

## 2020-01-09 NOTE — Patient Instructions (Signed)
Handouts given:  Polyps, Hemorrhoids, Diverticulosis Resume previous diet Continue current medications Await pathology results Start using supplemental fiber like:  Citrucel, Fibercon, Konsyl or Metamucil Repeat colonoscopy in 3-5 years  YOU HAD AN ENDOSCOPIC PROCEDURE TODAY AT [facility]:   Refer to the procedure report that was given to you for any specific questions about what was found during the examination.  If the procedure report does not answer your questions, please call your gastroenterologist to clarify.  If you requested that your care partner not be given the details of your procedure findings, then the procedure report has been included in a sealed envelope for you to review at your convenience later.  YOU SHOULD EXPECT: Some feelings of bloating in the abdomen. Passage of more gas than usual.  Walking can help get rid of the air that was put into your GI tract during the procedure and reduce the bloating. If you had a lower endoscopy (such as a colonoscopy or flexible sigmoidoscopy) you may notice spotting of blood in your stool or on the toilet paper. If you underwent a bowel prep for your procedure, you may not have a normal bowel movement for a few days.  Please Note:  You might notice some irritation and congestion in your nose or some drainage.  This is from the oxygen used during your procedure.  There is no need for concern and it should clear up in a day or so.  SYMPTOMS TO REPORT IMMEDIATELY:   Following lower endoscopy (colonoscopy or flexible sigmoidoscopy):  Excessive amounts of blood in the stool  Significant tenderness or worsening of abdominal pains  Swelling of the abdomen that is new, acute  Fever of 100F or higher   For urgent or emergent issues, a gastroenterologist can be reached at any hour by calling [phone]. Do not use MyChart messaging for urgent concerns.    DIET:  We do recommend a small meal at first, but then you may  proceed to your regular diet.  Drink plenty of fluids but you should avoid alcoholic beverages for 24 hours.  ACTIVITY:  You should plan to take it easy for the rest of today and you should NOT DRIVE or use heavy machinery until tomorrow (because of the sedation medicines used during the test).    FOLLOW UP: Our staff will call the number listed on your records 48-72 hours following your procedure to check on you and address any questions or concerns that you may have regarding the information given to you following your procedure. If we do not reach you, we will leave a message.  We will attempt to reach you two times.  During this call, we will ask if you have developed any symptoms of COVID 19. If you develop any symptoms (ie: fever, flu-like symptoms, shortness of breath, cough etc.) before then, please call [phone].  If you test positive for Covid 19 in the 2 weeks post procedure, please call and report this information to [place].    If any biopsies were taken you will be contacted by phone or by letter within the next 1-3 weeks.  Please call us at [phone] if you have not heard about the biopsies in 3 weeks.    SIGNATURES/CONFIDENTIALITY: You and/or your care partner have signed paperwork which will be entered into your electronic medical record.  These signatures attest to the fact that that the information above on your After Visit Summary has been reviewed and is understood.  Full  responsibility of the confidentiality of this discharge information lies with you and/or your care-partner.

## 2020-01-09 NOTE — Progress Notes (Signed)
A and O x3. Report to RN. Tolerated MAC anesthesia well. 

## 2020-01-09 NOTE — Progress Notes (Signed)
VS by SP    Pt's states no medical or surgical changes since previsit or office visit.  

## 2020-01-09 NOTE — Op Note (Signed)
[place] Patient Name: Stephanie Powell Procedure Date: 01/09/2020 10:10 AM MRN: [id number] Endoscopist: Gerrit Heck , MD Age: 69 Referring MD:  Date of Birth: [birth year] Gender: Female Account #: [id number] Procedure:                Colonoscopy Indications:              Surveillance: Personal history of adenomatous                            polyps on last colonoscopy > 5 years ago                           Colonoscopy in 02/2014 at outside facility notable                            for 2 mm tubular adenoma, with recommendation to                            repeat in 5 years. Medicines:                Monitored Anesthesia Care Procedure:                Pre-Anesthesia Assessment:                           - Prior to the procedure, a History and Physical                            was performed, and patient medications and                            allergies were reviewed. The patient's tolerance of                            previous anesthesia was also reviewed. The risks                            and benefits of the procedure and the sedation                            options and risks were discussed with the patient.                            All questions were answered, and informed consent                            was obtained. Prior Anticoagulants: The patient has                            taken no previous anticoagulant or antiplatelet                            agents. ASA Grade Assessment: II - A patient with  mild systemic disease. After reviewing the risks                            and benefits, the patient was deemed in                            satisfactory condition to undergo the procedure.                           After obtaining informed consent, the colonoscope                            was passed under direct vision. Throughout the                            procedure, the patient's blood pressure, pulse, and                             oxygen saturations were monitored continuously. The                            Colonoscope was introduced through the anus and                            advanced to the the terminal ileum. The colonoscopy                            was performed without difficulty. The patient                            tolerated the procedure well. The quality of the                            bowel preparation was good. The terminal ileum,                            ileocecal valve, appendiceal orifice, and rectum                            were photographed. Scope In: 10:22:41 AM Scope Out: 10:44:17 AM Scope Withdrawal Time: 0 hours 17 minutes 22 seconds  Total Procedure Duration: 0 hours 21 minutes 36 seconds  Findings:                 The perianal and digital rectal examinations were                            normal.                           Three sessile polyps were found in the ascending                            colon (1) and cecum (2). The polyps were 3 to 5 mm  in size. These polyps were removed with a cold                            snare. Resection and retrieval were complete.                            Estimated blood loss was minimal.                           A 8 mm polyp was found in the transverse colon. The                            polyp was sessile. The polyp was removed with a                            cold snare. Resection and retrieval were complete.                            Estimated blood loss was minimal.                           Multiple small and large-mouthed diverticula were                            found in the sigmoid colon.                           Non-bleeding internal hemorrhoids were found during                            retroflexion. The hemorrhoids were small and Grade                            II (internal hemorrhoids that prolapse but reduce                            spontaneously).                            The terminal ileum appeared normal. Complications:            No immediate complications. Estimated Blood Loss:     Estimated blood loss was minimal. Impression:               - Three 3 to 5 mm polyps in the ascending colon and                            in the cecum, removed with a cold snare. Resected                            and retrieved.                           - One 8 mm polyp in the transverse colon, removed  with a cold snare. Resected and retrieved.                           - Diverticulosis in the sigmoid colon.                           - Non-bleeding internal hemorrhoids.                           - The examined portion of the ileum was normal. Recommendation:           - Patient has a contact number available for                            emergencies. The signs and symptoms of potential                            delayed complications were discussed with the                            patient. Return to normal activities tomorrow.                            Written discharge instructions were provided to the                            patient.                           - Resume previous diet.                           - Continue present medications.                           - Await pathology results.                           - Repeat colonoscopy in 3 - 5 years for                            surveillance based on pathology results.                           - Return to GI office PRN.                           - Use fiber, for example Citrucel, Fibercon, Konsyl                            or Metamucil.                           - Internal hemorrhoids were noted on this study and                            may  be amenable to hemorrhoid band ligation. If you                            are interested in further treatment of these                            hemorrhoids with band ligation, please contact my                             clinic to set up an appointment for evaluation and                            treatment. Gerrit Heck, MD 01/09/2020 10:49:11 AM

## 2020-01-09 NOTE — Progress Notes (Signed)
Called to room to assist during endoscopic procedure.  Patient ID and intended procedure confirmed with present staff. Received instructions for my participation in the procedure from the performing physician.  

## 2020-01-13 ENCOUNTER — Telehealth: Payer: Self-pay

## 2020-01-13 NOTE — Telephone Encounter (Signed)
  Follow up Call-  Call back number 01/09/2020  Post procedure Call Back phone  # [phone]  Permission to leave phone message Yes     Patient questions:  Do you have a fever, pain , or abdominal swelling? No. Pain Score  0 *  Have you tolerated food without any problems? Yes.    Have you been able to return to your normal activities? Yes.    Do you have any questions about your discharge instructions: Diet   No. Medications  No. Follow up visit  No.  Do you have questions or concerns about your Care? No.  Actions: * If pain score is 4 or above: 1. No action needed, pain <4.Have you developed a fever since your procedure? no  2.   Have you had an respiratory symptoms (SOB or cough) since your procedure? no  3.   Have you tested positive for COVID 19 since your procedure no  4.   Have you had any family members/close contacts diagnosed with the COVID 19 since your procedure?  no   If yes to any of these questions please route to Joylene John, RN and Joella Prince, RN

## 2020-01-19 ENCOUNTER — Other Ambulatory Visit: Payer: Self-pay

## 2020-01-19 ENCOUNTER — Ambulatory Visit
Admission: RE | Admit: 2020-01-19 | Discharge: 2020-01-19 | Disposition: A | Discharge status: Home / Self Care | Payer: Medicare Other | Source: Ambulatory Visit | Attending: Family Medicine | Admitting: Family Medicine

## 2020-01-19 ENCOUNTER — Encounter: Payer: Self-pay | Admitting: Gastroenterology

## 2020-01-19 DIAGNOSIS — Z1231 Encounter for screening mammogram for malignant neoplasm of breast: Secondary | ICD-10-CM

## 2020-01-19 DIAGNOSIS — Z780 Asymptomatic menopausal state: Secondary | ICD-10-CM

## 2020-01-19 DIAGNOSIS — M8580 Other specified disorders of bone density and structure, unspecified site: Secondary | ICD-10-CM

## 2020-01-20 ENCOUNTER — Ambulatory Visit: Payer: Medicare Other

## 2020-01-23 ENCOUNTER — Other Ambulatory Visit: Payer: Self-pay | Admitting: Family Medicine

## 2020-01-23 DIAGNOSIS — R928 Other abnormal and inconclusive findings on diagnostic imaging of breast: Secondary | ICD-10-CM

## 2020-02-06 ENCOUNTER — Ambulatory Visit
Admission: RE | Admit: 2020-02-06 | Discharge: 2020-02-06 | Disposition: A | Discharge status: Home / Self Care | Payer: Medicare Other | Source: Ambulatory Visit | Attending: Family Medicine | Admitting: Family Medicine

## 2020-02-06 ENCOUNTER — Other Ambulatory Visit: Payer: Self-pay

## 2020-02-06 DIAGNOSIS — R928 Other abnormal and inconclusive findings on diagnostic imaging of breast: Secondary | ICD-10-CM

## 2020-07-25 ENCOUNTER — Other Ambulatory Visit: Payer: Self-pay | Admitting: Family Medicine

## 2021-02-14 ENCOUNTER — Other Ambulatory Visit: Payer: Self-pay | Admitting: Family Medicine

## 2021-02-14 DIAGNOSIS — Z1231 Encounter for screening mammogram for malignant neoplasm of breast: Secondary | ICD-10-CM

## 2021-02-20 IMAGING — MG MM DIGITAL DIAGNOSTIC UNILAT*L* W/ TOMO W/ CAD
4 series · 4 of 12 positions shown · non-contrast
Comparison: Previous exam(s).

CLINICAL DATA: Screening recall for a possible left breast mass.

EXAM:
DIGITAL DIAGNOSTIC LEFT MAMMOGRAM WITH CAD AND TOMO
ULTRASOUND LEFT BREAST

[L CC synth-2D]
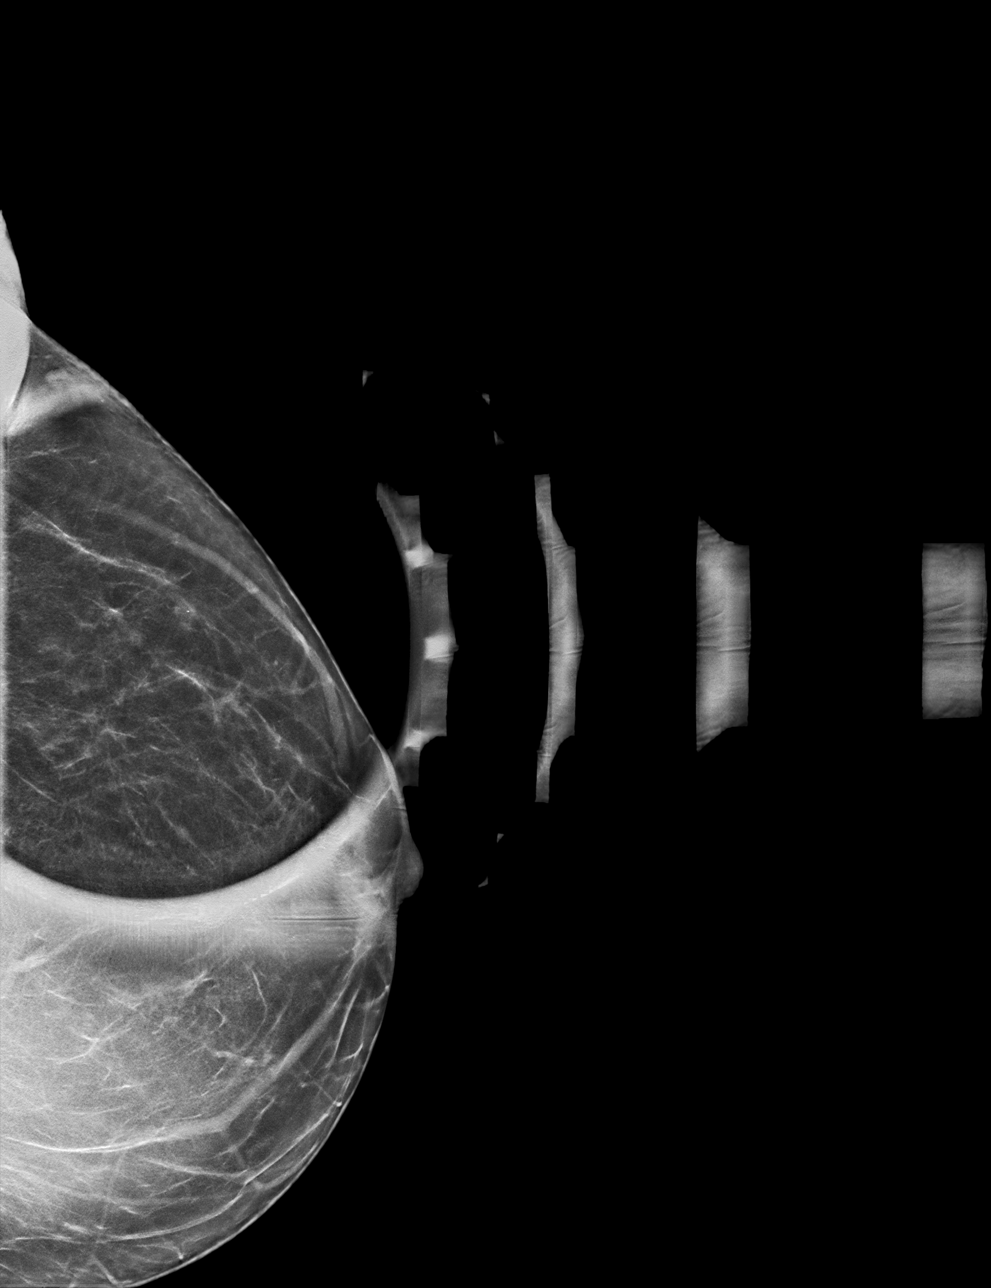

[L MLO synth-2D]
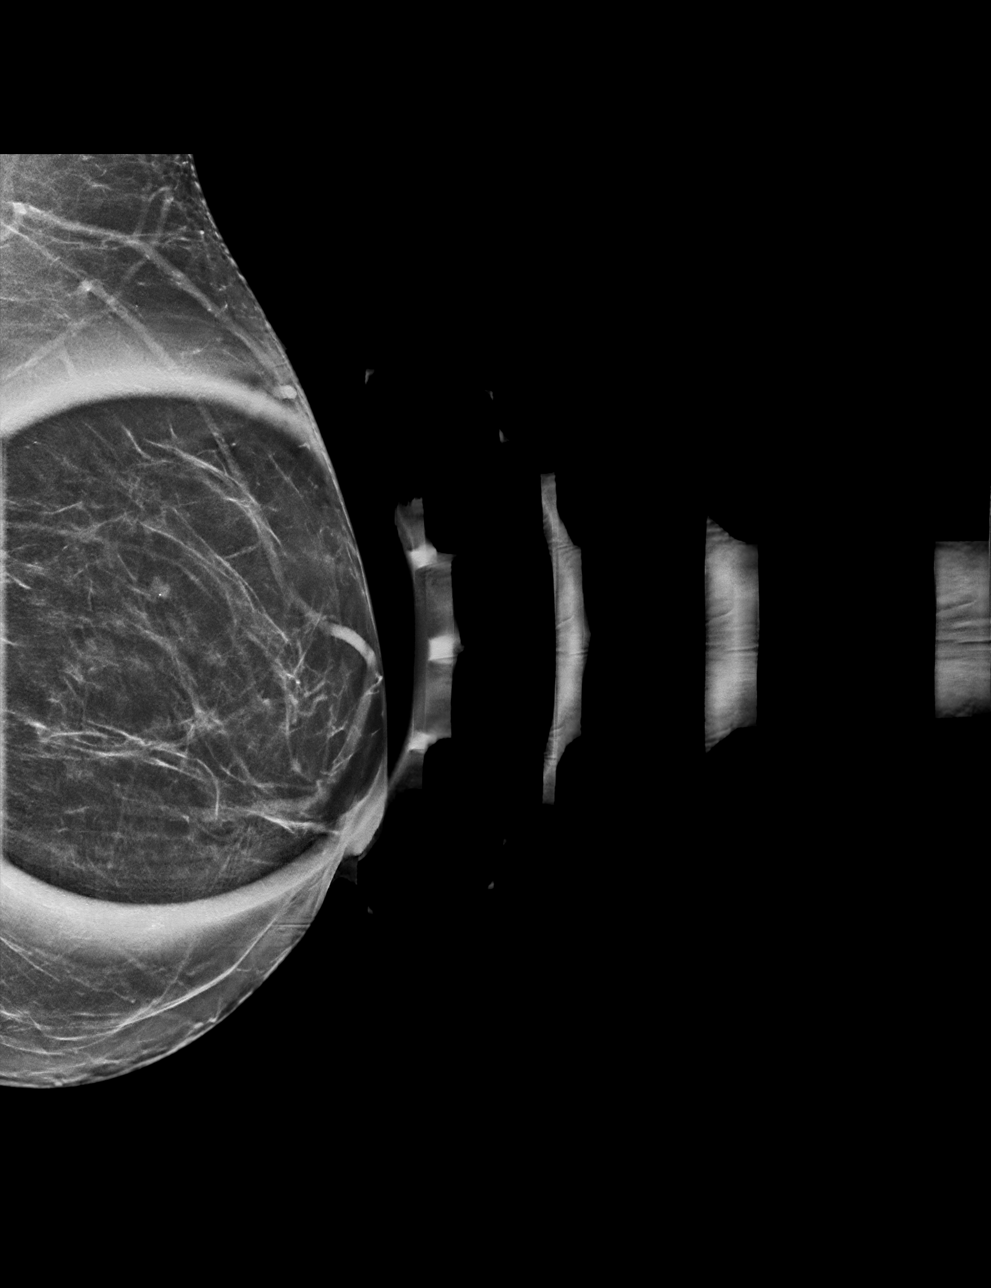

[L CC tomo · tomo slice 27/54.0]
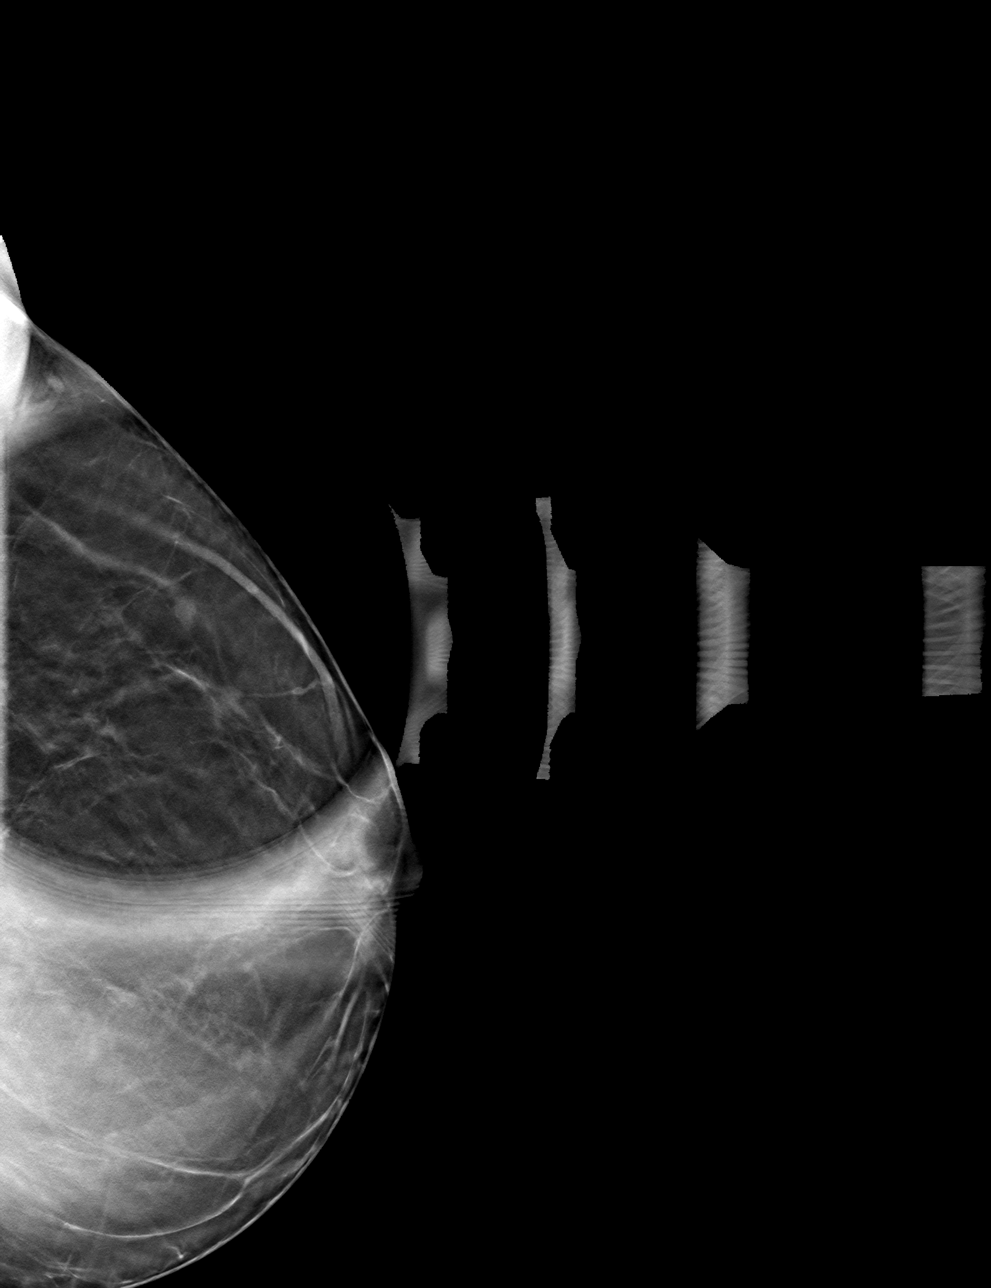

[L MLO tomo · tomo slice 31/62.0]
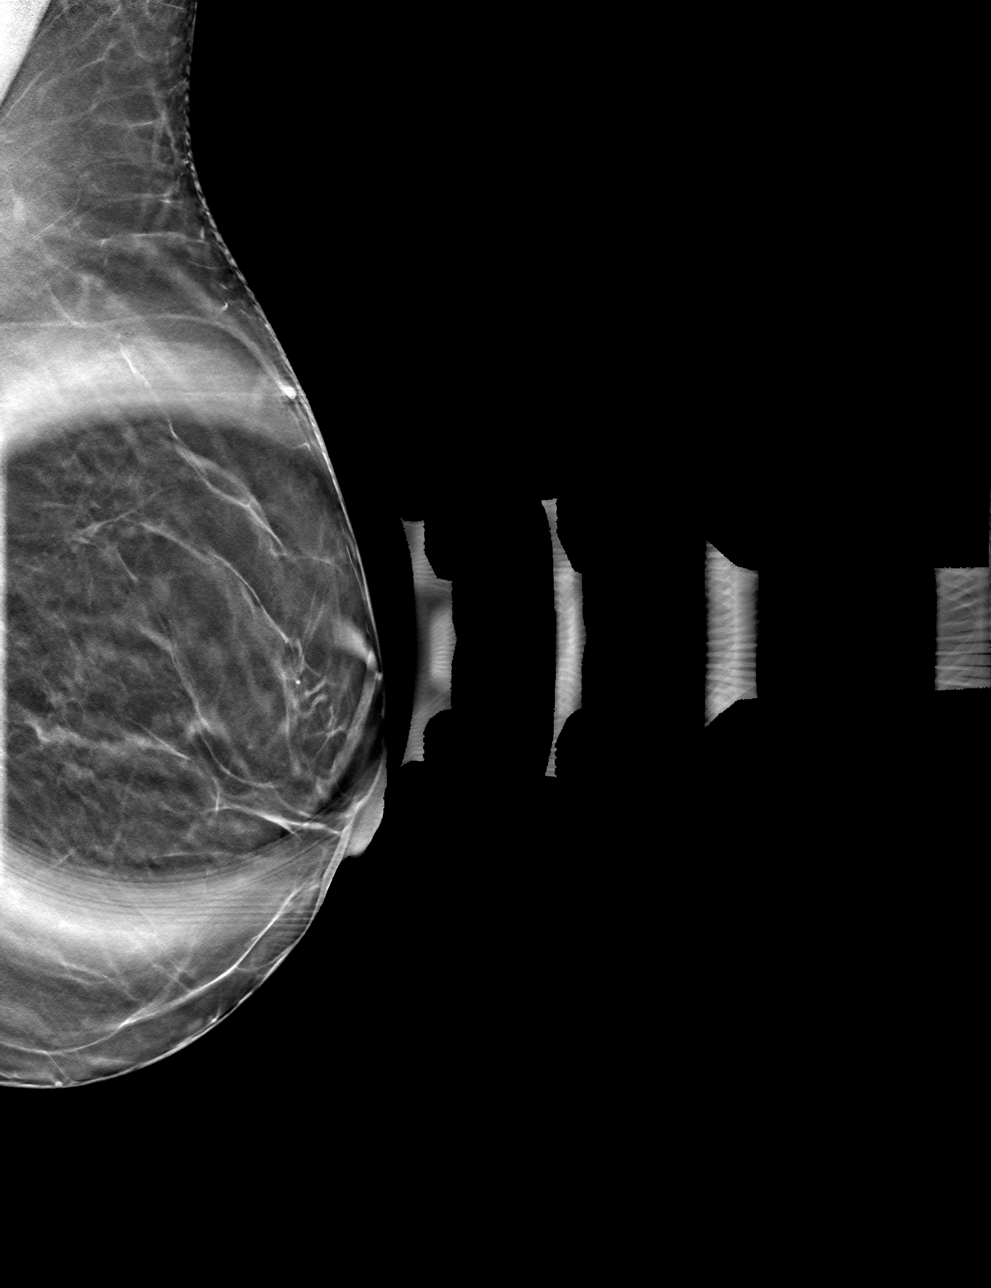

[4 of 12 positions shown; findings below may reference images not displayed]

ACR Breast Density Category b: There are scattered areas of
fibroglandular density.
FINDINGS: Possible mass noted in the lateral left breast, persists as small, 3
mm, circumscribed mass with a single round calcification along its
inferior/medial wall.

There are no other suspicious masses, no areas of architectural
distortion and no suspicious calcifications.

Mammographic images were processed with CAD.

Targeted ultrasound is performed, showing an oval cyst in the left
breast at 3 o'clock, 3 cm the nipple, measuring 3 mm in size,
containing a single echogenic focus along its dependent wall
consistent with a calcification. This corresponds in size, shape and
location to the mammographic mass.
IMPRESSION: 1. No evidence of breast malignancy.
2. Small benign cyst in the lateral left breast.

RECOMMENDATION:
Screening mammogram in one year.(Code:[id number])

I have discussed the findings and recommendations with the patient.
If applicable, a reminder letter will be sent to the patient
regarding the next appointment.

BI-RADS CATEGORY  2: Benign.

## 2021-02-28 ENCOUNTER — Ambulatory Visit
Admission: RE | Admit: 2021-02-28 | Discharge: 2021-02-28 | Disposition: A | Discharge status: Home / Self Care | Payer: Medicare Other | Source: Ambulatory Visit

## 2021-02-28 ENCOUNTER — Other Ambulatory Visit: Payer: Self-pay

## 2021-02-28 DIAGNOSIS — Z1231 Encounter for screening mammogram for malignant neoplasm of breast: Secondary | ICD-10-CM

## 2021-05-25 ENCOUNTER — Other Ambulatory Visit: Payer: Self-pay

## 2021-05-25 ENCOUNTER — Ambulatory Visit (INDEPENDENT_AMBULATORY_CARE_PROVIDER_SITE_OTHER): Payer: Medicare Other | Admitting: Family Medicine

## 2021-05-25 ENCOUNTER — Encounter: Payer: Self-pay | Admitting: Family Medicine

## 2021-05-25 VITALS — BP 142/96 | HR 82 | Temp 98.2°F | Ht 65.0 in | Wt 166.2 lb

## 2021-05-25 DIAGNOSIS — M8580 Other specified disorders of bone density and structure, unspecified site: Secondary | ICD-10-CM | POA: Diagnosis not present

## 2021-05-25 DIAGNOSIS — I10 Essential (primary) hypertension: Secondary | ICD-10-CM | POA: Diagnosis not present

## 2021-05-25 DIAGNOSIS — K635 Polyp of colon: Secondary | ICD-10-CM

## 2021-05-25 DIAGNOSIS — Z780 Asymptomatic menopausal state: Secondary | ICD-10-CM

## 2021-05-25 MED ORDER — LISINOPRIL 5 MG PO TABS: 5.0000 mg | ORAL_TABLET | Freq: Every day | ORAL | 3 refills | Status: DC

## 2021-05-25 NOTE — Progress Notes (Signed)
Subjective  Chief Complaint  Patient presents with   Annual Exam    HPI: Stephanie Powell is a 71 y.o. female who presents to [facility] [place] today for a Female Wellness Visit. She also has the concerns and/or needs as listed above in the chief complaint. These will be addressed in addition to the Health Maintenance Visit.   Wellness Visit: annual visit with health maintenance review and exam without Pap  Health maintenance: Screens are current.  Reviewed colonoscopy from last year that found a serrated polyp.  Will have repeat in 3 years.  Bone density showed stable osteopenia.  Repeat in 3 years.  She continues to be very active, joined a hiking group.  Happy and healthy.  Chronic disease f/u and/or acute problem visit: (deemed necessary to be done in addition to the wellness visit): Hypertension: She stopped her medications and then restarted lisinopril 2.5 mg daily.  She did run out of that and then started amlodipine 2.5 mg daily.  She has not checked her home blood pressures.  Blood pressure is elevated here today.  She feels well. Osteopenia: Continue calcium and vitamin D.  Hikes.  Understands how to prevent osteoporosis.  Assessment  1. Essential hypertension   2. Serrated polyp of colon   3. Osteopenia after menopause      Plan  Female Wellness Visit: Age appropriate Health Maintenance and Prevention measures were discussed with patient. Included topics are cancer screening recommendations, ways to keep healthy (see AVS) including dietary and exercise recommendations, regular eye and dental care, use of seat belts, and avoidance of moderate alcohol use and tobacco use.  BMI: discussed patient's BMI and encouraged positive lifestyle modifications to help get to or maintain a target BMI. HM needs and immunizations were addressed and ordered. See below for orders. See HM and immunization section for updates. Routine labs and screening tests ordered  including cmp, cbc and lipids where appropriate. Discussed recommendations regarding Vit D and calcium supplementation (see AVS)  Chronic disease management visit and/or acute problem visit: Hypertension: Discussed goal.  Recommend restarting lisinopril at 5 mg daily.  Restart home monitoring.  Keep a log.  Recheck here in 3 to 6 months.  Discussed goal of 120s over 70s consistently.  Low-salt diet.  Mild weight loss.  Continue activity.  Check renal function, electrolytes and lipid panel today.  Check CBC. Osteopenia: Stable: Recheck at year 3.  Continue vitamin D, calcium and weightbearing activity like hiking. Serrated colon polyp: Precancerous.  She will follow-up as directed  Follow up: 3 to 6 months for blood pressure recheck Orders Placed This Encounter  Procedures   CBC with Differential/Platelet   Comprehensive metabolic panel   Lipid panel   TSH   No orders of the defined types were placed in this encounter.     Body mass index is 27.66 kg/m. Wt Readings from Last 3 Encounters:  05/25/21 166 lb 3.2 oz (75.4 kg)  01/09/20 158 lb (71.7 kg)  12/23/19 158 lb (71.7 kg)     Patient Active Problem List   Diagnosis Date Noted   Osteopenia after menopause 11/12/2019    [facility] DEXA 2020: T = -1.8 lowest DEXA 01/2020: T = -1.7; stable osteopenia. Recheck 2-3 years    Serrated polyp of colon 11/12/2019    Colonoscopy, [place]: 2015 Colonoscopy [place]: 2022, precancerous. Repeat in 3 years    Essential hypertension 11/04/2018   Health Maintenance  Topic Date Due   Zoster Vaccines- Shingrix (  1 of 2) Never done   MAMMOGRAM  02/28/2022   COLONOSCOPY (Pts 45-73yrs Insurance coverage will need to be confirmed)  01/09/2023   DEXA SCAN  01/19/2023   Pneumonia Vaccine 44+ Years old  Completed   INFLUENZA VACCINE  Completed   COVID-19 Vaccine  Completed   Hepatitis C Screening  Completed   HPV VACCINES  Aged Out   TETANUS/TDAP  Discontinued   Immunization History   Administered Date(s) Administered   Fluad Quad(high Dose 65+) 12/04/2018, 01/27/2021   Influenza-Unspecified 01/13/2018   Moderna Covid-19 Vaccine Bivalent Booster 49yrs & up 01/27/2021   Moderna Sars-Covid-2 Vaccination 05/13/2019, 06/12/2019   Pneumococcal Conjugate-13 09/05/2017   Pneumococcal Polysaccharide-23 12/04/2018   We updated and reviewed the patient's past history in detail and it is documented below. Allergies: Patient is allergic to sulfa antibiotics. Past Medical History Patient  has a past medical history of Allergy, Colon polyps (11/12/2019), Essential hypertension (11/04/2018), Hypertension, Osteopenia after menopause (11/12/2019), and Osteoporosis. Past Surgical History Patient  has a past surgical history that includes Knee orthosopy; Bunionectomy; and Tonsillectomy and adenoidectomy. Family History: Patient family history includes Colon polyps in her sister; Heart attack in her father; High Cholesterol in her mother; High blood pressure in her mother, sister, and sister; Stroke in her mother. Social History:  Patient  reports that she has never smoked. She has never used smokeless tobacco. She reports current alcohol use. She reports that she does not use drugs.  Review of Systems: Constitutional: negative for fever or malaise Ophthalmic: negative for photophobia, double vision or loss of vision Cardiovascular: negative for chest pain, dyspnea on exertion, or new LE swelling Respiratory: negative for SOB or persistent cough Gastrointestinal: negative for abdominal pain, change in bowel habits or melena Genitourinary: negative for dysuria or gross hematuria, no abnormal uterine bleeding or disharge Musculoskeletal: negative for new gait disturbance or muscular weakness Integumentary: negative for new or persistent rashes, no breast lumps Neurological: negative for TIA or stroke symptoms Psychiatric: negative for SI or delusions Allergic/Immunologic: negative for  hives  Patient Care Team    Relationship Specialty Notifications Start End  Leamon Arnt, MD PCP - General Family Medicine  11/04/18   Lavena Bullion, DO Consulting Physician Gastroenterology  05/25/21     Objective  Vitals: BP (!) 142/96    Pulse 82    Temp 98.2 F (36.8 C) (Temporal)    Ht 5\' 5"  (1.651 m)    Wt 166 lb 3.2 oz (75.4 kg)    SpO2 96%    BMI 27.66 kg/m  General:  Well developed, well nourished, no acute distress  Psych:  Alert and orientedx3,normal mood and affect HEENT:  Normocephalic, atraumatic, non-icteric sclera,  supple neck without adenopathy, mass or thyromegaly Cardiovascular:  Normal S1, S2, RRR without gallop, rub or murmur Respiratory:  Good breath sounds bilaterally, CTAB with normal respiratory effort Gastrointestinal: normal bowel sounds, soft, non-tender, no noted masses. No HSM MSK: no deformities, contusions. Joints are without erythema or swelling.  Skin:  Warm, no rashes or suspicious lesions noted Neurologic:    Mental status is normal. CN 2-11 are normal. Gross motor and sensory exams are normal. Normal gait. No tremor   Commons side effects, risks, benefits, and alternatives for medications and treatment plan prescribed today were discussed, and the patient expressed understanding of the given instructions. Patient is instructed to call or message via MyChart if he/she has any questions or concerns regarding our treatment plan. No barriers to understanding were  identified. We discussed Red Flag symptoms and signs in detail. Patient expressed understanding regarding what to do in case of urgent or emergency type symptoms.  Medication list was reconciled, printed and provided to the patient in AVS. Patient instructions and summary information was reviewed with the patient as documented in the AVS. This note was prepared with assistance of Dragon voice recognition software. Occasional wrong-word or sound-a-like substitutions may have occurred due to the  inherent limitations of voice recognition software  This visit occurred during the SARS-CoV-2 public health emergency.  Safety protocols were in place, including screening questions prior to the visit, additional usage of staff PPE, and extensive cleaning of exam room while observing appropriate contact time as indicated for disinfecting solutions.

## 2021-05-25 NOTE — Patient Instructions (Signed)
Please return in 3-6 months to recheck blood pressure.   I will release your lab results to you on your MyChart account with further instructions. You may see the results before I do, but when I review them I will send you a message with my report or have my assistant call you if things need to be discussed. Please reply to my message with any questions. Thank you!   We want your blood pressure to be 120s/70s or less. Let's restart the lisinopril 5mg  daily as we discussed.  Enjoy your hiking!  If you have any questions or concerns, please don't hesitate to send me a message via MyChart or call the office at [phone]. Thank you for visiting with [place] today! It's our pleasure caring for you.   Hypertension, Adult High blood pressure (hypertension) is when the force of blood pumping through the arteries is too strong. The arteries are the blood vessels that carry blood from the heart throughout the body. Hypertension forces the heart to work harder to pump blood and may cause arteries to become narrow or stiff. Untreated or uncontrolled hypertension can cause a heart attack, heart failure, a stroke, kidney disease, and other problems. A blood pressure reading consists of a higher number over a lower number. Ideally, your blood pressure should be below 120/80. The first ("top") number is called the systolic pressure. It is a measure of the pressure in your arteries as your heart beats. The second ("bottom") number is called the diastolic pressure. It is a measure of the pressure in your arteries as the heart relaxes. What are the causes? The exact cause of this condition is not known. There are some conditions that result in or are related to high blood pressure. What increases the risk? Some risk factors for high blood pressure are under your control. The following factors may make you more likely to develop this condition: Smoking. Having type 2 diabetes mellitus, high cholesterol, or both. Not  getting enough exercise or physical activity. Being overweight. Having too much fat, sugar, calories, or salt (sodium) in your diet. Drinking too much alcohol. Some risk factors for high blood pressure may be difficult or impossible to change. Some of these factors include: Having chronic kidney disease. Having a family history of high blood pressure. Age. Risk increases with age. Race. You may be at higher risk if you are African American. Gender. Men are at higher risk than women before age 42. After age 21, women are at higher risk than men. Having obstructive sleep apnea. Stress. What are the signs or symptoms? High blood pressure may not cause symptoms. Very high blood pressure (hypertensive crisis) may cause: Headache. Anxiety. Shortness of breath. Nosebleed. Nausea and vomiting. Vision changes. Severe chest pain. Seizures. How is this diagnosed? This condition is diagnosed by measuring your blood pressure while you are seated, with your arm resting on a flat surface, your legs uncrossed, and your feet flat on the floor. The cuff of the blood pressure monitor will be placed directly against the skin of your upper arm at the level of your heart. It should be measured at least twice using the same arm. Certain conditions can cause a difference in blood pressure between your right and left arms. Certain factors can cause blood pressure readings to be lower or higher than normal for a short period of time: When your blood pressure is higher when you are in a health care provider's office than when you are at home, this is  called white coat hypertension. Most people with this condition do not need medicines. When your blood pressure is higher at home than when you are in a health care provider's office, this is called masked hypertension. Most people with this condition may need medicines to control blood pressure. If you have a high blood pressure reading during one visit or you have  normal blood pressure with other risk factors, you may be asked to: Return on a different day to have your blood pressure checked again. Monitor your blood pressure at home for 1 week or longer. If you are diagnosed with hypertension, you may have other blood or imaging tests to help your health care provider understand your overall risk for other conditions. How is this treated? This condition is treated by making healthy lifestyle changes, such as eating healthy foods, exercising more, and reducing your alcohol intake. Your health care provider may prescribe medicine if lifestyle changes are not enough to get your blood pressure under control, and if: Your systolic blood pressure is above 130. Your diastolic blood pressure is above 80. Your personal target blood pressure may vary depending on your medical conditions, your age, and other factors. Follow these instructions at home: Eating and drinking  Eat a diet that is high in fiber and potassium, and low in sodium, added sugar, and fat. An example eating plan is called the DASH (Dietary Approaches to Stop Hypertension) diet. To eat this way: Eat plenty of fresh fruits and vegetables. Try to fill one half of your plate at each meal with fruits and vegetables. Eat whole grains, such as whole-wheat pasta, brown rice, or whole-grain bread. Fill about one fourth of your plate with whole grains. Eat or drink low-fat dairy products, such as skim milk or low-fat yogurt. Avoid fatty cuts of meat, processed or cured meats, and poultry with skin. Fill about one fourth of your plate with lean proteins, such as fish, chicken without skin, beans, eggs, or tofu. Avoid pre-made and processed foods. These tend to be higher in sodium, added sugar, and fat. Reduce your daily sodium intake. Most people with hypertension should eat less than 1,500 mg of sodium a day. Do not drink alcohol if: Your health care provider tells you not to drink. You are pregnant, may  be pregnant, or are planning to become pregnant. If you drink alcohol: Limit how much you use to: 0-1 drink a day for women. 0-2 drinks a day for men. Be aware of how much alcohol is in your drink. In the [place] one drink equals one 12 oz bottle of beer (355 mL), one 5 oz glass of wine (148 mL), or one 1 oz glass of hard liquor (44 mL). Lifestyle  Work with your health care provider to maintain a healthy body weight or to lose weight. Ask what an ideal weight is for you. Get at least 30 minutes of exercise most days of the week. Activities may include walking, swimming, or biking. Include exercise to strengthen your muscles (resistance exercise), such as Pilates or lifting weights, as part of your weekly exercise routine. Try to do these types of exercises for 30 minutes at least 3 days a week. Do not use any products that contain nicotine or tobacco, such as cigarettes, e-cigarettes, and chewing tobacco. If you need help quitting, ask your health care provider. Monitor your blood pressure at home as told by your health care provider. Keep all follow-up visits as told by your health care provider. This is important.  Medicines Take over-the-counter and prescription medicines only as told by your health care provider. Follow directions carefully. Blood pressure medicines must be taken as prescribed. Do not skip doses of blood pressure medicine. Doing this puts you at risk for problems and can make the medicine less effective. Ask your health care provider about side effects or reactions to medicines that you should watch for. Contact a health care provider if you: Think you are having a reaction to a medicine you are taking. Have headaches that keep coming back (recurring). Feel dizzy. Have swelling in your ankles. Have trouble with your vision. Get help right away if you: Develop a severe headache or confusion. Have unusual weakness or numbness. Feel faint. Have severe pain in your chest  or abdomen. Vomit repeatedly. Have trouble breathing. Summary Hypertension is when the force of blood pumping through your arteries is too strong. If this condition is not controlled, it may put you at risk for serious complications. Your personal target blood pressure may vary depending on your medical conditions, your age, and other factors. For most people, a normal blood pressure is less than 120/80. Hypertension is treated with lifestyle changes, medicines, or a combination of both. Lifestyle changes include losing weight, eating a healthy, low-sodium diet, exercising more, and limiting alcohol. This information is not intended to replace advice given to you by your health care provider. Make sure you discuss any questions you have with your health care provider. Document Revised: 12/05/2017 Document Reviewed: 12/05/2017 Elsevier Patient Education  [place].

## 2021-05-26 LAB — CBC WITH DIFFERENTIAL/PLATELET
Basophils Absolute: 0.1 10*3/uL (ref 0.0–0.1)
Basophils Relative: 1.2 % (ref 0.0–3.0)
Eosinophils Absolute: 0.2 10*3/uL (ref 0.0–0.7)
Eosinophils Relative: 2.8 % (ref 0.0–5.0)
HCT: 42.7 % (ref 36.0–46.0)
Hemoglobin: 14.1 g/dL (ref 12.0–15.0)
Lymphocytes Relative: 35.1 % (ref 12.0–46.0)
Lymphs Abs: 3 10*3/uL (ref 0.7–4.0)
MCHC: 33 g/dL (ref 30.0–36.0)
MCV: 95.1 fl (ref 78.0–100.0)
Monocytes Absolute: 0.7 10*3/uL (ref 0.1–1.0)
Monocytes Relative: 7.9 % (ref 3.0–12.0)
Neutro Abs: 4.5 10*3/uL (ref 1.4–7.7)
Neutrophils Relative %: 53 % (ref 43.0–77.0)
Platelets: 302 10*3/uL (ref 150.0–400.0)
RBC: 4.49 Mil/uL (ref 3.87–5.11)
RDW: 12.7 % (ref 11.5–15.5)
WBC: 8.5 10*3/uL (ref 4.0–10.5)

## 2021-05-26 LAB — LIPID PANEL
Cholesterol: 211 mg/dL — ABNORMAL HIGH (ref 0–200)
HDL: 41.8 mg/dL (ref >39.00)
NonHDL: 169.59
Total CHOL/HDL Ratio: 5
Triglycerides: 229 mg/dL — ABNORMAL HIGH (ref 0.0–149.0)
VLDL: 45.8 mg/dL — ABNORMAL HIGH (ref 0.0–40.0)

## 2021-05-26 LAB — COMPREHENSIVE METABOLIC PANEL
ALT: 24 U/L (ref 0–35)
AST: 20 U/L (ref 0–37)
Albumin: 4.3 g/dL (ref 3.5–5.2)
Alkaline Phosphatase: 64 U/L (ref 39–117)
BUN: 17 mg/dL (ref 6–23)
CO2: 34 mEq/L — ABNORMAL HIGH (ref 19–32)
Calcium: 9.7 mg/dL (ref 8.4–10.5)
Chloride: 100 mEq/L (ref 96–112)
Creatinine, Ser: 0.88 mg/dL (ref 0.40–1.20)
GFR: 67.14 mL/min (ref >60.00)
Glucose, Bld: 81 mg/dL (ref 70–99)
Potassium: 4 mEq/L (ref 3.5–5.1)
Sodium: 137 mEq/L (ref 135–145)
Total Bilirubin: 0.5 mg/dL (ref 0.2–1.2)
Total Protein: 7.5 g/dL (ref 6.0–8.3)

## 2021-05-26 LAB — TSH: TSH: 4.1 u[IU]/mL (ref 0.35–5.50)

## 2021-05-26 LAB — LDL CHOLESTEROL, DIRECT: Direct LDL: 142 mg/dL

## 2021-05-30 ENCOUNTER — Encounter: Payer: Self-pay | Admitting: Family Medicine

## 2021-05-31 ENCOUNTER — Encounter: Payer: Self-pay | Admitting: Family Medicine

## 2021-05-31 DIAGNOSIS — E782 Mixed hyperlipidemia: Secondary | ICD-10-CM | POA: Insufficient documentation

## 2021-06-01 ENCOUNTER — Telehealth: Payer: Self-pay

## 2021-06-01 NOTE — Telephone Encounter (Signed)
LVM for patient to return call. 

## 2021-06-07 ENCOUNTER — Telehealth: Payer: Self-pay

## 2021-06-07 NOTE — Telephone Encounter (Signed)
Patient called back to discuss lab results. Patient gave a verbal understanding. Also Stated she will continue to try to work on things on her own for awhile to lower her cholesterol.

## 2021-06-07 NOTE — Telephone Encounter (Signed)
I LVM for patient to give a call back regarding lab results. This is my second attempt to contact patient. Spoke with patients sister states she is okay and she will try to get in touch with patient to call back.

## 2021-07-25 ENCOUNTER — Encounter: Payer: Self-pay | Admitting: Family Medicine

## 2021-08-22 ENCOUNTER — Ambulatory Visit: Payer: Medicare Other | Admitting: Family Medicine

## 2021-11-10 ENCOUNTER — Ambulatory Visit: Payer: Self-pay

## 2021-11-10 NOTE — Patient Outreach (Signed)
  Care Coordination   11/10/2021 Name: Stephanie Powell MRN: [id number] DOB: [birth year]   Care Coordination Outreach Attempts:  An unsuccessful telephone outreach was attempted today to offer the patient information about available care coordination services as a benefit of their health plan.   Follow Up Plan:  Additional outreach attempts will be made to offer the patient care coordination information and services.   Encounter Outcome:  No Answer  Care Coordination Interventions Activated:  No   Care Coordination Interventions:  No, not indicated    Daneen Schick, BSW, CDP Social Worker, Certified Dementia Practitioner Care Coordination [phone]

## 2021-11-12 ENCOUNTER — Encounter: Payer: Self-pay | Admitting: Family Medicine

## 2021-11-28 ENCOUNTER — Ambulatory Visit (INDEPENDENT_AMBULATORY_CARE_PROVIDER_SITE_OTHER): Payer: Medicare Other | Admitting: Family Medicine

## 2021-11-28 ENCOUNTER — Encounter: Payer: Self-pay | Admitting: Family Medicine

## 2021-11-28 VITALS — BP 124/82 | HR 63 | Temp 98.3°F | Ht 65.0 in | Wt 155.6 lb

## 2021-11-28 DIAGNOSIS — E782 Mixed hyperlipidemia: Secondary | ICD-10-CM | POA: Diagnosis not present

## 2021-11-28 DIAGNOSIS — I10 Essential (primary) hypertension: Secondary | ICD-10-CM

## 2021-11-28 LAB — COMPREHENSIVE METABOLIC PANEL
ALT: 13 U/L (ref 0–35)
AST: 15 U/L (ref 0–37)
Albumin: 4.1 g/dL (ref 3.5–5.2)
Alkaline Phosphatase: 57 U/L (ref 39–117)
BUN: 16 mg/dL (ref 6–23)
CO2: 28 mEq/L (ref 19–32)
Calcium: 9.6 mg/dL (ref 8.4–10.5)
Chloride: 102 mEq/L (ref 96–112)
Creatinine, Ser: 0.86 mg/dL (ref 0.40–1.20)
GFR: 68.77 mL/min (ref >60.00)
Glucose, Bld: 94 mg/dL (ref 70–99)
Potassium: 4.1 mEq/L (ref 3.5–5.1)
Sodium: 139 mEq/L (ref 135–145)
Total Bilirubin: 0.7 mg/dL (ref 0.2–1.2)
Total Protein: 7.2 g/dL (ref 6.0–8.3)

## 2021-11-28 LAB — LIPID PANEL
Cholesterol: 199 mg/dL (ref 0–200)
HDL: 39.2 mg/dL (ref >39.00)
LDL Cholesterol: 140 mg/dL — ABNORMAL HIGH (ref 0–99)
NonHDL: 160.28
Total CHOL/HDL Ratio: 5
Triglycerides: 99 mg/dL (ref 0.0–149.0)
VLDL: 19.8 mg/dL (ref 0.0–40.0)

## 2021-11-28 NOTE — Patient Instructions (Signed)
Please return in 6 months for your annual complete physical; please come fasting.   I will release your lab results to you on your MyChart account with further instructions. You may see the results before I do, but when I review them I will send you a message with my report or have my assistant call you if things need to be discussed. Please reply to my message with any questions. Thank you!   Glad you are doing so well!!!!   If you have any questions or concerns, please don't hesitate to send me a message via MyChart or call the office at [phone]. Thank you for visiting with [place] today! It's our pleasure caring for you.

## 2021-11-28 NOTE — Progress Notes (Signed)
Subjective  CC:  Chief Complaint  Patient presents with   Hypertension    Pt here to F/U with Bp    HPI: Stephanie Powell is a 71 y.o. female who presents to the office today to address the problems listed above in the chief complaint. Hypertension f/u: Control is good . Pt reports she is doing well. taking medications as instructed, no medication side effects noted, no TIAs, no chest pain on exertion, no dyspnea on exertion, no swelling of ankles. On lisinopril 5 with a rare systolic in the 00Q. Overall feels well. Exercising regularly now and weight is down. Eating very well. And feels great. She denies adverse effects from his BP medications. Compliance with medication is good.  Here to f/u on lipids: would prefer to not use statin unless clearly indicated.   Assessment  1. Essential hypertension   2. Mixed hyperlipidemia      Plan   Hypertension f/u: BP control is well controlled. Continue lisinopril 5 and home monitoring. If bp starts running low consistently will back off dose of lisinopril. Check renal and lytes.  HLD: discussed indications for statin. Risks/benefits. Will recheck fasting lipids today and see where she falls on ascvd risk score. To start statin if indicated, but levels may have improved with better diet and weight loss and exercise.   Education regarding management of these chronic disease states was given. Management strategies discussed on successive visits include dietary and exercise recommendations, goals of achieving and maintaining IBW, and lifestyle modifications aiming for adequate sleep and minimizing stressors.   Follow up: 6 mo for cpe  No orders of the defined types were placed in this encounter.  No orders of the defined types were placed in this encounter.     BP Readings from Last 3 Encounters:  11/28/21 124/82  05/25/21 (!) 142/96  01/09/20 (!) 122/98   Wt Readings from Last 3 Encounters:  11/28/21 155 lb 9.6 oz (70.6 kg)   05/25/21 166 lb 3.2 oz (75.4 kg)  01/09/20 158 lb (71.7 kg)    Lab Results  Component Value Date   CHOL 211 (H) 05/25/2021   CHOL 228 (H) 11/12/2019   CHOL 190 11/04/2018   Lab Results  Component Value Date   HDL 41.80 05/25/2021   HDL 40 (L) 11/12/2019   HDL 39.40 11/04/2018   Lab Results  Component Value Date   LDLCALC 154 (H) 11/12/2019   LDLCALC 123 (H) 11/04/2018   Lab Results  Component Value Date   TRIG 229.0 (H) 05/25/2021   TRIG 204 (H) 11/12/2019   TRIG 139.0 11/04/2018   Lab Results  Component Value Date   CHOLHDL 5 05/25/2021   CHOLHDL 5.7 (H) 11/12/2019   CHOLHDL 5 11/04/2018   Lab Results  Component Value Date   LDLDIRECT 142.0 05/25/2021   Lab Results  Component Value Date   CREATININE 0.88 05/25/2021   BUN 17 05/25/2021   NA 137 05/25/2021   K 4.0 05/25/2021   CL 100 05/25/2021   CO2 34 (H) 05/25/2021    The 10-year ASCVD risk score (Arnett DK, et al., 2019) is: 11.6%   Values used to calculate the score:     Age: 59 years     Sex: Female     Is Non-Hispanic African American: No     Diabetic: No     Tobacco smoker: No     Systolic Blood Pressure: 676 mmHg     Is BP treated: Yes  HDL Cholesterol: 41.8 mg/dL     Total Cholesterol: 211 mg/dL  I reviewed the patients updated PMH, FH, and SocHx.    Patient Active Problem List   Diagnosis Date Noted   Mixed hyperlipidemia 05/31/2021   Osteopenia after menopause 11/12/2019   Serrated polyp of colon 11/12/2019   Essential hypertension 11/04/2018    Allergies: Sulfa antibiotics  Social History: Patient  reports that she has never smoked. She has never used smokeless tobacco. She reports current alcohol use. She reports that she does not use drugs.  Current Meds  Medication Sig   lisinopril (ZESTRIL) 5 MG tablet Take 1 tablet (5 mg total) by mouth daily.    Review of Systems: Cardiovascular: negative for chest pain, palpitations, leg swelling, orthopnea Respiratory: negative  for SOB, wheezing or persistent cough Gastrointestinal: negative for abdominal pain Genitourinary: negative for dysuria or gross hematuria  Objective  Vitals: BP 124/82   Pulse 63   Temp 98.3 F (36.8 C)   Ht '5\' 5"'$  (1.651 m)   Wt 155 lb 9.6 oz (70.6 kg)   SpO2 95%   BMI 25.89 kg/m  General: no acute distress  Psych:  Alert and oriented, normal mood and affect HEENT:  Normocephalic, atraumatic, supple neck  Cardiovascular:  RRR without murmur. no edema Respiratory:  Good breath sounds bilaterally, CTAB with normal respiratory effort Neurologic:   Mental status is normal Commons side effects, risks, benefits, and alternatives for medications and treatment plan prescribed today were discussed, and the patient expressed understanding of the given instructions. Patient is instructed to call or message via MyChart if he/she has any questions or concerns regarding our treatment plan. No barriers to understanding were identified. We discussed Red Flag symptoms and signs in detail. Patient expressed understanding regarding what to do in case of urgent or emergency type symptoms.  Medication list was reconciled, printed and provided to the patient in AVS. Patient instructions and summary information was reviewed with the patient as documented in the AVS. This note was prepared with assistance of Dragon voice recognition software. Occasional wrong-word or sound-a-like substitutions may have occurred due to the inherent limitations of voice recognition software  This visit occurred during the SARS-CoV-2 public health emergency.  Safety protocols were in place, including screening questions prior to the visit, additional usage of staff PPE, and extensive cleaning of exam room while observing appropriate contact time as indicated for disinfecting solutions.

## 2021-11-29 ENCOUNTER — Encounter: Payer: Self-pay | Admitting: Family Medicine

## 2021-11-29 MED ORDER — ATORVASTATIN CALCIUM 10 MG PO TABS: 10.0000 mg | ORAL_TABLET | Freq: Every day | ORAL | 3 refills | Status: DC

## 2021-11-29 NOTE — Addendum Note (Signed)
Addended by: Billey Chang on: 11/29/2021 08:34 AM   Modules accepted: Orders

## 2021-12-15 ENCOUNTER — Telehealth: Payer: Self-pay

## 2021-12-15 NOTE — Patient Outreach (Signed)
  Care Coordination   12/15/2021 Name: CAMIKA MARSICO MRN: [id number] DOB: May 07, 1950   Care Coordination Outreach Attempts:  A second unsuccessful outreach was attempted today to offer the patient with information about available care coordination services as a benefit of their health plan.     Follow Up Plan:  Additional outreach attempts will be made to offer the patient care coordination information and services.   Encounter Outcome:  No Answer  Care Coordination Interventions Activated:  No   Care Coordination Interventions:  No, not indicated    Daneen Schick, BSW, CDP Social Worker, Certified Dementia Practitioner Care Coordination [phone]

## 2021-12-28 ENCOUNTER — Telehealth: Payer: Medicare Other | Admitting: Physician Assistant

## 2021-12-28 DIAGNOSIS — U071 COVID-19: Secondary | ICD-10-CM

## 2021-12-28 MED ORDER — NIRMATRELVIR/RITONAVIR (PAXLOVID)TABLET: 3.0000 | ORAL_TABLET | Freq: Two times a day (BID) | ORAL | 0 refills | Status: AC

## 2021-12-28 NOTE — Progress Notes (Signed)
Virtual Visit Consent   Stephanie Powell, you are scheduled for a virtual visit with a [facility] provider today. Just as with appointments in the office, your consent must be obtained to participate. Your consent will be active for this visit and any virtual visit you may have with one of our providers in the next 365 days. If you have a MyChart account, a copy of this consent can be sent to you electronically.  As this is a virtual visit, video technology does not allow for your provider to perform a traditional examination. This may limit your provider's ability to fully assess your condition. If your provider identifies any concerns that need to be evaluated in person or the need to arrange testing (such as labs, EKG, etc.), we will make arrangements to do so. Although advances in technology are sophisticated, we cannot ensure that it will always work on either your end or our end. If the connection with a video visit is poor, the visit may have to be switched to a telephone visit. With either a video or telephone visit, we are not always able to ensure that we have a secure connection.  By engaging in this virtual visit, you consent to the provision of healthcare and authorize for your insurance to be billed (if applicable) for the services provided during this visit. Depending on your insurance coverage, you may receive a charge related to this service.  I need to obtain your verbal consent now. Are you willing to proceed with your visit today? SADDIE SANDEEN has provided verbal consent on 12/28/2021 for a virtual visit (video or telephone). Mar Daring, PA-C  Date: 12/28/2021 9:00 AM  Virtual Visit via Video Note   I, Mar Daring, connected with  Stephanie Powell  ([id number], 11-25-1950) on 12/28/21 at  8:45 AM EDT by a video-enabled telemedicine application and verified that I am speaking with the correct person using two identifiers.  Location: Patient: Virtual  Visit Location Patient: Home Provider: Virtual Visit Location Provider: Home Office   I discussed the limitations of evaluation and management by telemedicine and the availability of in person appointments. The patient expressed understanding and agreed to proceed.    History of Present Illness: Stephanie Powell is a 71 y.o. who identifies as a female who was assigned female at birth, and is being seen today for Covid 88.  HPI: URI  This is a new problem. Episode onset: Tested on at home test this morning; Symptoms started last night. The problem has been gradually worsening. Maximum temperature: low grade. The fever has been present for Less than 1 day. Associated symptoms include congestion, coughing, headaches, rhinorrhea, sinus pain and a sore throat. Pertinent negatives include no diarrhea, ear pain, nausea, plugged ear sensation or vomiting. Associated symptoms comments: myalgias. She has tried nothing for the symptoms. The treatment provided no relief.    BP: 132/94 HR 88 T: 99.9  Has had 6 covid vaccines  Problems:  Patient Active Problem List   Diagnosis Date Noted   Mixed hyperlipidemia 05/31/2021   Osteopenia after menopause 11/12/2019   Serrated polyp of colon 11/12/2019   Essential hypertension 11/04/2018    Allergies:  Allergies  Allergen Reactions   Sulfa Antibiotics Nausea And Vomiting   Medications:  Current Outpatient Medications:    nirmatrelvir/ritonavir EUA (PAXLOVID) 20 x 150 MG & 10 x '100MG'$  TABS, Take 3 tablets by mouth 2 (two) times daily for 5 days. (Take nirmatrelvir 150 mg two tablets  twice daily for 5 days and ritonavir 100 mg one tablet twice daily for 5 days) Patient GFR is 68.77, Disp: 30 tablet, Rfl: 0   lisinopril (ZESTRIL) 5 MG tablet, Take 1 tablet (5 mg total) by mouth daily., Disp: 90 tablet, Rfl: 3  Observations/Objective: Patient is well-developed, well-nourished in no acute distress.  Resting comfortably at home.  Head is normocephalic,  atraumatic.  No labored breathing.  Speech is clear and coherent with logical content.  Patient is alert and oriented at baseline.    Assessment and Plan: 1. COVID-19 - nirmatrelvir/ritonavir EUA (PAXLOVID) 20 x 150 MG & 10 x '100MG'$  TABS; Take 3 tablets by mouth 2 (two) times daily for 5 days. (Take nirmatrelvir 150 mg two tablets twice daily for 5 days and ritonavir 100 mg one tablet twice daily for 5 days) Patient GFR is 68.77  Dispense: 30 tablet; Refill: 0  - Continue OTC symptomatic management of choice - Will send OTC vitamins and supplement information through AVS - Paxlovid prescribed - Patient enrolled in MyChart symptom monitoring - Push fluids - Rest as needed - Discussed return precautions and when to seek in-person evaluation, sent via AVS as well   Follow Up Instructions: I discussed the assessment and treatment plan with the patient. The patient was provided an opportunity to ask questions and all were answered. The patient agreed with the plan and demonstrated an understanding of the instructions.  A copy of instructions were sent to the patient via MyChart unless otherwise noted below.    The patient was advised to call back or seek an in-person evaluation if the symptoms worsen or if the condition fails to improve as anticipated.  Time:  I spent 15 minutes with the patient via telehealth technology discussing the above problems/concerns.    Mar Daring, PA-C

## 2021-12-28 NOTE — Patient Instructions (Signed)
Lorna Dibble, thank you for joining Mar Daring, PA-C for today's virtual visit.  While this provider is not your primary care provider (PCP), if your PCP is located in our provider database this encounter information will be shared with them immediately following your visit.  Consent: (Patient) Stephanie Powell provided verbal consent for this virtual visit at the beginning of the encounter.  Current Medications:  Current Outpatient Medications:    nirmatrelvir/ritonavir EUA (PAXLOVID) 20 x 150 MG & 10 x '100MG'$  TABS, Take 3 tablets by mouth 2 (two) times daily for 5 days. (Take nirmatrelvir 150 mg two tablets twice daily for 5 days and ritonavir 100 mg one tablet twice daily for 5 days) Patient GFR is 68.77, Disp: 30 tablet, Rfl: 0   lisinopril (ZESTRIL) 5 MG tablet, Take 1 tablet (5 mg total) by mouth daily., Disp: 90 tablet, Rfl: 3   Medications ordered in this encounter:  Meds ordered this encounter  Medications   nirmatrelvir/ritonavir EUA (PAXLOVID) 20 x 150 MG & 10 x '100MG'$  TABS    Sig: Take 3 tablets by mouth 2 (two) times daily for 5 days. (Take nirmatrelvir 150 mg two tablets twice daily for 5 days and ritonavir 100 mg one tablet twice daily for 5 days) Patient GFR is 68.77    Dispense:  30 tablet    Refill:  0    Order Specific Question:   Supervising Provider    Answer:   Chase Picket [id number]     *If you need refills on other medications prior to your next appointment, please contact your pharmacy*  Follow-Up: Call back or seek an in-person evaluation if the symptoms worsen or if the condition fails to improve as anticipated.  Other Instructions  Quarantine and Isolation Quarantine and isolation refer to local and travel restrictions to protect the public and travelers from contagious diseases that constitute a public health threat. Contagious diseases are diseases that can spread from one person to another. Quarantine and isolation help to protect the  public by preventing exposure to people who have or may have a contagious disease. Isolation separates people who are sick with a contagious disease from people who are not sick. Quarantine separates and restricts the movement of people who were exposed to a contagious disease to see if they become sick. You may be put in quarantine or isolation if you have been exposed to or diagnosed with any of the following diseases: Severe acute respiratory syndromes, such as COVID-19. Cholera. Diphtheria. Tuberculosis. Plague. Smallpox. Yellow fever. Viral hemorrhagic fevers, such as [place], Ebola, and [place]. When to quarantine or isolate Follow these rules, whether you have been vaccinated or not: Stay home and isolate from others when you are sick with a contagious disease. Isolate when you test positive for a contagious disease, even if you do not have symptoms. Isolate if you are sick and suspect that you may have a contagious disease. If you suspect that you have a contagious disease, get tested. If your test results are negative, you can end your isolation. If your test results are positive, follow the full isolation recommendations as told by your health care provider or local health authorities. Quarantine and stay away from others when you have been in close contact with someone who has tested positive for a contagious disease. Close contact is defined as being less than 6 ft (1.8 m) away from an infected person for a total of 15 minutes or more over a 24-hour period. Do  not go to places where you are unable to wear a mask, such as restaurants and some gyms. Stay home and separate from others as much as possible. Avoid being around people who may get very sick from the contagious disease that you have. Use a separate bathroom, if possible. Do not travel. For travel guidance, visit the CDC's travel webpage at SwankBlog.no Follow these instructions at home: Medicines  Take  over-the-counter and prescription medicines as told by your health care provider. Finish all antibiotic medicine even when you start to feel better. Stay up to date with all your vaccines. Get scheduled vaccines and boosters as recommended by your health care provider. Lifestyle Wear a high-quality mask if you must be around others at home and in public, if recommended. Improve air flow (ventilation) at home to help prevent the disease from spreading to other people, if possible. Do not share personal household items, like cups, towels, and utensils. Practice everyday hygiene and cleaning. General instructions Talk to your health care provider if you have a weakened body defense system (immune system). People with a weakened immune system may have a reduced immune response to vaccines. You may need to follow current prevention measures, including wearing a well-fitting mask, avoiding crowds, and avoiding poorly ventilated indoor places. Monitor symptoms and follow health care provider instructions, which may include resting, drinking fluids, and taking medicines. Follow specific isolation and quarantine recommendations if you are in places that can lead to disease outbreaks, such as correctional and detention facilities, homeless shelters, and cruise ships. Return to your normal activities as told by your health care provider. Ask your health care provider what activities are safe for you. Keep all follow-up visits. This is important. Where to find more information CDC: SodaWaters.hu Contact a health care provider if: You have a fever. You have signs and symptoms that return or get worse after isolation. Get help right away if: You have difficulty breathing. You have chest pain. These symptoms may be an emergency. Get help right away. Call 911. Do not wait to see if the symptoms will go away. Do not drive yourself to the hospital. Summary Isolation and quarantine help  protect the public by preventing exposure to people who have or may have a contagious disease. Isolate when you are sick or when you test positive, even if you do not have symptoms. Quarantine and stay away from others when you have been in close contact with someone who has tested positive for a contagious disease. This information is not intended to replace advice given to you by your health care provider. Make sure you discuss any questions you have with your health care provider. Document Revised: 04/07/2021 Document Reviewed: 03/17/2021 Elsevier Patient Education  [place] (Nirmatrelvir; Ritonavir Tablets) What is this medication? NIRMATRELVIR; RITONAVIR (NIR ma TREL vir; ri TOE na veer) treats mild to moderate COVID-19. It may help people who are at high risk of developing severe illness. This medication works by limiting the spread of the virus in your body. The FDA has allowed the emergency use of this medication. This medicine may be used for other purposes; ask your health care provider or pharmacist if you have questions. COMMON BRAND NAME(S): PAXLOVID What should I tell my care team before I take this medication? They need to know if you have any of these conditions: Any allergies Any serious illness Kidney disease Liver disease An unusual or allergic reaction to nirmatrelvir, ritonavir, other medications, foods, dyes, or preservatives Pregnant  or trying to get pregnant Breast-feeding How should I use this medication? This product contains 2 different medications that are packaged together. For the standard dose, take 2 pink tablets of nirmatrelvir with 1 white tablet of ritonavir (3 tablets total) by mouth with water twice daily. Talk to your care team if you have kidney disease. You may need a different dose. Swallow the tablets whole. You can take it with or without food. If it upsets your stomach, take it with food. Take all of this medication unless your  care team tells you to stop it early. Keep taking it even if you think you are better. Talk to your care team about the use of this medication in children. While it may be prescribed for children as young as 12 years for selected conditions, precautions do apply. Overdosage: If you think you have taken too much of this medicine contact a poison control center or emergency room at once. NOTE: This medicine is only for you. Do not share this medicine with others. What if I miss a dose? If you miss a dose, take it as soon as you can unless it is more than 8 hours late. If it is more than 8 hours late, skip the missed dose. Take the next dose at the normal time. Do not take extra or 2 doses at the same time to make up for the missed dose. What may interact with this medication? Do not take this medication with any of the following medications: Alfuzosin Certain medications for anxiety or sleep like midazolam, triazolam Certain medications for cancer like apalutamide, enzalutamide Certain medications for cholesterol like lovastatin, simvastatin Certain medications for irregular heart beat like amiodarone, dronedarone, flecainide, propafenone, quinidine Certain medications for pain like meperidine, piroxicam Certain medications for psychotic disorders like clozapine, lurasidone, pimozide Certain medications for seizures like carbamazepine, phenobarbital, phenytoin Colchicine Eletriptan Eplerenone Ergot alkaloids like dihydroergotamine, ergonovine, ergotamine, methylergonovine Finerenone Flibanserin Ivabradine Lomitapide Naloxegol Ranolazine Rifampin Sildenafil Silodosin St. John's Wort Tolvaptan Ubrogepant Voclosporin This medication may also interact with the following medications: Bedaquiline Birth control pills Bosentan Certain antibiotics like erythromycin or clarithromycin Certain medications for blood pressure like amlodipine, diltiazem, felodipine, nicardipine,  nifedipine Certain medications for cancer like abemaciclib, ceritinib, dasatinib, encorafenib, ibrutinib, ivosidenib, neratinib, nilotinib, venetoclax, vinblastine, vincristine Certain medications for cholesterol like atorvastatin, rosuvastatin Certain medications for depression like bupropion, trazodone Certain medications for fungal infections like isavuconazonium, itraconazole, ketoconazole, voriconazole Certain medications for hepatitis C like elbasvir; grazoprevir, dasabuvir; ombitasvir; paritaprevir; ritonavir, glecaprevir; pibrentasvir, sofosbuvir; velpatasvir; voxilaprevir Certain medications for HIV or AIDS Certain medications for irregular heartbeat like lidocaine Certain medications that treat or prevent blood clots like rivaroxaban, warfarin Digoxin Fentanyl Medications that lower your chance of fighting infection like cyclosporine, sirolimus, tacrolimus Methadone Quetiapine Rifabutin Salmeterol Steroid medications like betamethasone, budesonide, ciclesonide, dexamethasone, fluticasone, methylprednisolone, mometasone, triamcinolone This list may not describe all possible interactions. Give your health care provider a list of all the medicines, herbs, non-prescription drugs, or dietary supplements you use. Also tell them if you smoke, drink alcohol, or use illegal drugs. Some items may interact with your medicine. What should I watch for while using this medication? Your condition will be monitored carefully while you are receiving this medication. Visit your care team for regular checkups. Tell your care team if your symptoms do not start to get better or if they get worse. If you have untreated HIV infection, this medication may lead to some HIV medications not working as well in the future. Birth control  may not work properly while you are taking this medication. Talk to your care team about using an extra method of birth control. What side effects may I notice from receiving this  medication? Side effects that you should report to your care team as soon as possible: Allergic reactions--skin rash, itching, hives, swelling of the face, lips, tongue, or throat Liver injury--right upper belly pain, loss of appetite, nausea, light-colored stool, dark yellow or brown urine, yellowing skin or eyes, unusual weakness or fatigue Redness, blistering, peeling, or loosening of the skin, including inside the mouth Side effects that usually do not require medical attention (report these to your care team if they continue or are bothersome): Change in taste Diarrhea General discomfort and fatigue Increase in blood pressure Muscle pain Nausea Stomach pain This list may not describe all possible side effects. Call your doctor for medical advice about side effects. You may report side effects to FDA at 1-800-FDA-1088. Where should I keep my medication? Keep out of the reach of children and pets. Store at room temperature between 20 and 25 degrees C (68 and 77 degrees F). Get rid of any unused medication after the expiration date. To get rid of medications that are no longer needed or have expired: Take the medication to a medication take-back program. Check with your pharmacy or law enforcement to find a location. If you cannot return the medication, check the label or package insert to see if the medication should be thrown out in the garbage or flushed down the toilet. If you are not sure, ask your care team. If it is safe to put it in the trash, take the medication out of the container. Mix the medication with cat litter, dirt, coffee grounds, or other unwanted substance. Seal the mixture in a bag or container. Put it in the trash. NOTE: This sheet is a summary. It may not cover all possible information. If you have questions about this medicine, talk to your doctor, pharmacist, or health care provider.  2023 Elsevier/Gold Standard (2020-04-05 00:00:00)   If you have been instructed  to have an in-person evaluation today at a local Urgent Care facility, please use the link below. It will take you to a list of all of our available [facility], including address, phone number and hours of operation. Please do not delay care.  [facility]  If you or a family member do not have a primary care provider, use the link below to schedule a visit and establish care. When you choose a [facility] primary care physician or advanced practice provider, you gain a long-term partner in health. Find a Primary Care Provider  Learn more about [facility] in-office and virtual care options: [facility]

## 2022-01-02 ENCOUNTER — Encounter: Payer: Self-pay | Admitting: *Deleted

## 2022-01-23 ENCOUNTER — Ambulatory Visit: Payer: Self-pay

## 2022-01-23 NOTE — Patient Outreach (Signed)
  Care Coordination   01/23/2022 Name: Stephanie Powell MRN: [id number] DOB: Jan 03, 1951   Care Coordination Outreach Attempts:  A third unsuccessful outreach was attempted today to offer the patient with information about available care coordination services as a benefit of their health plan.   Follow Up Plan:  No further outreach attempts will be made at this time. We have been unable to contact the patient to offer or enroll patient in care coordination services  Encounter Outcome:  No Answer  Care Coordination Interventions Activated:  No   Care Coordination Interventions:  No, not indicated    Daneen Schick, BSW, CDP Social Worker, Certified Dementia Practitioner [facility] [phone]

## 2022-01-31 ENCOUNTER — Other Ambulatory Visit: Payer: Self-pay | Admitting: Family Medicine

## 2022-01-31 DIAGNOSIS — Z1231 Encounter for screening mammogram for malignant neoplasm of breast: Secondary | ICD-10-CM

## 2022-03-23 ENCOUNTER — Encounter: Payer: Self-pay | Admitting: *Deleted

## 2022-03-27 ENCOUNTER — Ambulatory Visit: Payer: Medicare Other

## 2022-05-18 ENCOUNTER — Ambulatory Visit
Admission: RE | Admit: 2022-05-18 | Discharge: 2022-05-18 | Disposition: A | Discharge status: Home / Self Care | Payer: Medicare Other | Source: Ambulatory Visit | Attending: Family Medicine | Admitting: Family Medicine

## 2022-05-18 DIAGNOSIS — Z1231 Encounter for screening mammogram for malignant neoplasm of breast: Secondary | ICD-10-CM

## 2022-05-20 ENCOUNTER — Other Ambulatory Visit: Payer: Self-pay | Admitting: Family Medicine

## 2022-08-11 ENCOUNTER — Ambulatory Visit (INDEPENDENT_AMBULATORY_CARE_PROVIDER_SITE_OTHER): Payer: Medicare Other | Admitting: Family Medicine

## 2022-08-11 ENCOUNTER — Encounter: Payer: Self-pay | Admitting: Family Medicine

## 2022-08-11 VITALS — BP 116/78 | HR 58 | Temp 97.9°F | Ht 65.0 in | Wt 160.2 lb

## 2022-08-11 DIAGNOSIS — M8589 Other specified disorders of bone density and structure, multiple sites: Secondary | ICD-10-CM

## 2022-08-11 DIAGNOSIS — K635 Polyp of colon: Secondary | ICD-10-CM

## 2022-08-11 DIAGNOSIS — M8580 Other specified disorders of bone density and structure, unspecified site: Secondary | ICD-10-CM | POA: Diagnosis not present

## 2022-08-11 DIAGNOSIS — Z780 Asymptomatic menopausal state: Secondary | ICD-10-CM

## 2022-08-11 DIAGNOSIS — E782 Mixed hyperlipidemia: Secondary | ICD-10-CM | POA: Diagnosis not present

## 2022-08-11 DIAGNOSIS — I10 Essential (primary) hypertension: Secondary | ICD-10-CM | POA: Diagnosis not present

## 2022-08-11 LAB — VITAMIN D 25 HYDROXY (VIT D DEFICIENCY, FRACTURES): VITD: 17.12 ng/mL — ABNORMAL LOW (ref 30.00–100.00)

## 2022-08-11 LAB — CBC WITH DIFFERENTIAL/PLATELET
Basophils Absolute: 0.1 10*3/uL (ref 0.0–0.1)
Basophils Relative: 1.8 % (ref 0.0–3.0)
Eosinophils Absolute: 0.2 10*3/uL (ref 0.0–0.7)
Eosinophils Relative: 3.8 % (ref 0.0–5.0)
HCT: 41.6 % (ref 36.0–46.0)
Hemoglobin: 13.9 g/dL (ref 12.0–15.0)
Lymphocytes Relative: 41.1 % (ref 12.0–46.0)
Lymphs Abs: 2.4 10*3/uL (ref 0.7–4.0)
MCHC: 33.4 g/dL (ref 30.0–36.0)
MCV: 95.7 fl (ref 78.0–100.0)
Monocytes Absolute: 0.5 10*3/uL (ref 0.1–1.0)
Monocytes Relative: 8.7 % (ref 3.0–12.0)
Neutro Abs: 2.6 10*3/uL (ref 1.4–7.7)
Neutrophils Relative %: 44.6 % (ref 43.0–77.0)
Platelets: 279 10*3/uL (ref 150.0–400.0)
RBC: 4.34 Mil/uL (ref 3.87–5.11)
RDW: 12.8 % (ref 11.5–15.5)
WBC: 5.8 10*3/uL (ref 4.0–10.5)

## 2022-08-11 LAB — TSH: TSH: 3.21 u[IU]/mL (ref 0.35–5.50)

## 2022-08-11 LAB — LIPID PANEL
Cholesterol: 190 mg/dL (ref 0–200)
HDL: 40.4 mg/dL (ref >39.00)
LDL Cholesterol: 126 mg/dL — ABNORMAL HIGH (ref 0–99)
NonHDL: 149.58
Total CHOL/HDL Ratio: 5
Triglycerides: 119 mg/dL (ref 0.0–149.0)
VLDL: 23.8 mg/dL (ref 0.0–40.0)

## 2022-08-11 LAB — COMPREHENSIVE METABOLIC PANEL
ALT: 12 U/L (ref 0–35)
AST: 14 U/L (ref 0–37)
Albumin: 4.2 g/dL (ref 3.5–5.2)
Alkaline Phosphatase: 60 U/L (ref 39–117)
BUN: 13 mg/dL (ref 6–23)
CO2: 29 mEq/L (ref 19–32)
Calcium: 9.9 mg/dL (ref 8.4–10.5)
Chloride: 102 mEq/L (ref 96–112)
Creatinine, Ser: 0.84 mg/dL (ref 0.40–1.20)
GFR: 70.39 mL/min (ref >60.00)
Glucose, Bld: 93 mg/dL (ref 70–99)
Potassium: 4.2 mEq/L (ref 3.5–5.1)
Sodium: 139 mEq/L (ref 135–145)
Total Bilirubin: 1.1 mg/dL (ref 0.2–1.2)
Total Protein: 7.2 g/dL (ref 6.0–8.3)

## 2022-08-11 MED ORDER — LISINOPRIL 5 MG PO TABS: 5.0000 mg | ORAL_TABLET | Freq: Every day | ORAL | 3 refills | Status: DC

## 2022-08-11 NOTE — Progress Notes (Signed)
Subjective  Chief Complaint  Patient presents with   Hypertension    HPI: Stephanie Powell is a 72 y.o. female who presents to [facility] today for a Female Wellness Visit. She also has the concerns and/or needs as listed above in the chief complaint. These will be addressed in addition to the Health Maintenance Visit.   Wellness Visit: annual visit with health maintenance review and exam without Pap  Health maintenance: Mammogram current.  History of serrated polyp and due for colonoscopy this year.  Active healthy lifestyle.  Continues to improve her diet.  Feels well.  No mood concerns.  No falls.  Immunizations are up-to-date. Chronic disease f/u and/or acute problem visit: (deemed necessary to be done in addition to the wellness visit): Hypertension remains very well-controlled on lisinopril 5 mg daily.  No adverse effects. We discussed her hyperlipidemia.  She has improved her diet.  Fasting for recheck.  Never started statins due to her concerns regarding weight gain in this class of medication.  Eating more plant-based meals. Osteopenia: Due for repeat bone density.  Does not take calcium or vitamin supplements but eats a healthy diet.  No fractures.   Assessment  1. Essential hypertension   2. Mixed hyperlipidemia   3. Osteopenia after menopause   4. Serrated polyp of colon   5. Asymptomatic menopausal state   6. Other specified disorders of bone density and structure, multiple sites      Plan  Female Wellness Visit: Age appropriate Health Maintenance and Prevention measures were discussed with patient. Included topics are cancer screening recommendations, ways to keep healthy (see AVS) including dietary and exercise recommendations, regular eye and dental care, use of seat belts, and avoidance of moderate alcohol use and tobacco use.  Patient to schedule colonoscopy with GI this year.  Mammogram current BMI: discussed patient's BMI and  encouraged positive lifestyle modifications to help get to or maintain a target BMI. HM needs and immunizations were addressed and ordered. See below for orders. See HM and immunization section for updates. Routine labs and screening tests ordered including cmp, cbc and lipids where appropriate. Discussed recommendations regarding Vit D and calcium supplementation (see AVS)  Chronic disease management visit and/or acute problem visit: Hypertension is very well-controlled on low-dose lisinopril 5 mg daily.  Recheck renal function and electrolytes today. Hyperlipidemia: Discussed benefits of statins.  Will recheck fasting lipids and calculate her atherosclerotic disease risk score.  If elevated, would recommend low-dose Crestor 3 times daily.  She may be amenable to this. Osteopenia: Recheck bone density.  Check vitamin D levels.  Recommend regular exercise and increase calcium and vitamin D intake.  Follow up: 12 months for complete physical Orders Placed This Encounter  Procedures   DG Bone Density   Comprehensive metabolic panel   CBC with Differential/Platelet   Lipid panel   VITAMIN D 25 Hydroxy (Vit-D Deficiency, Fractures)   TSH   Meds ordered this encounter  Medications   lisinopril (ZESTRIL) 5 MG tablet    Sig: Take 1 tablet (5 mg total) by mouth daily.    Dispense:  90 tablet    Refill:  3      Body mass index is 26.66 kg/m. Wt Readings from Last 3 Encounters:  08/11/22 160 lb 3.2 oz (72.7 kg)  11/28/21 155 lb 9.6 oz (70.6 kg)  05/25/21 166 lb 3.2 oz (75.4 kg)     Patient Active Problem List   Diagnosis Date Noted  Mixed hyperlipidemia 05/31/2021    Offered statin 05/2021    Osteopenia after menopause 11/12/2019    [facility] DEXA 2020: T = -1.8 lowest DEXA 01/2020: T = -1.7; stable osteopenia. Recheck 2-3 years    Serrated polyp of colon 11/12/2019    Colonoscopy, [place]: 2015 Colonoscopy [place]: 2022, precancerous. Repeat in 3 years    Essential  hypertension 11/04/2018   Health Maintenance  Topic Date Due   Medicare Annual Wellness (AWV)  Never done   COVID-19 Vaccine (5 - 2023-24 season) 08/27/2022 (Originally 01/22/2022)   INFLUENZA VACCINE  11/09/2022   COLONOSCOPY (Pts 45-53yrs Insurance coverage will need to be confirmed)  01/09/2023   DEXA SCAN  01/19/2023   MAMMOGRAM  05/19/2023   Pneumonia Vaccine 31+ Years old  Completed   Hepatitis C Screening  Completed   Zoster Vaccines- Shingrix  Completed   HPV VACCINES  Aged Out   DTaP/Tdap/Td  Discontinued   Immunization History  Administered Date(s) Administered   Fluad Quad(high Dose 65+) 12/04/2018, 01/27/2021   Influenza-Unspecified 01/13/2018   Moderna Covid-19 Vaccine Bivalent Booster 58yrs & up 01/27/2021, 11/27/2021   Moderna Sars-Covid-2 Vaccination 05/13/2019, 06/12/2019   Pneumococcal Conjugate-13 09/05/2017   Pneumococcal Polysaccharide-23 12/04/2018   Zoster Recombinat (Shingrix) 04/11/2019, 09/09/2019   We updated and reviewed the patient's past history in detail and it is documented below. Allergies: Patient is allergic to sulfa antibiotics. Past Medical History Patient  has a past medical history of Allergy, Colon polyps (11/12/2019), Essential hypertension (11/04/2018), Hypertension, Osteopenia after menopause (11/12/2019), and Osteoporosis. Past Surgical History Patient  has a past surgical history that includes Knee orthosopy; Bunionectomy; and Tonsillectomy and adenoidectomy. Family History: Patient family history includes Colon polyps in her sister; Heart attack in her father; High Cholesterol in her mother; High blood pressure in her mother, sister, and sister; Stroke in her mother. Social History:  Patient  reports that she has never smoked. She has never used smokeless tobacco. She reports current alcohol use. She reports that she does not use drugs.  Review of Systems: Constitutional: negative for fever or malaise Ophthalmic: negative for  photophobia, double vision or loss of vision Cardiovascular: negative for chest pain, dyspnea on exertion, or new LE swelling Respiratory: negative for SOB or persistent cough Gastrointestinal: negative for abdominal pain, change in bowel habits or melena Genitourinary: negative for dysuria or gross hematuria, no abnormal uterine bleeding or disharge Musculoskeletal: negative for new gait disturbance or muscular weakness Integumentary: negative for new or persistent rashes, no breast lumps Neurological: negative for TIA or stroke symptoms Psychiatric: negative for SI or delusions Allergic/Immunologic: negative for hives  Patient Care Team    Relationship Specialty Notifications Start End  Willow Ora, MD PCP - General Family Medicine  11/04/18   Shellia Cleverly, DO Consulting Physician Gastroenterology  05/25/21     Objective  Vitals: BP 116/78   Pulse (!) 58   Temp 97.9 F (36.6 C)   Ht 5\' 5"  (1.651 m)   Wt 160 lb 3.2 oz (72.7 kg)   SpO2 94%   BMI 26.66 kg/m  General:  Well developed, well nourished, no acute distress  Psych:  Alert and orientedx3,normal mood and affect HEENT:  Normocephalic, atraumatic, non-icteric sclera,  supple neck without adenopathy, mass or thyromegaly Cardiovascular:  Normal S1, S2, RRR without gallop, rub or murmur Respiratory:  Good breath sounds bilaterally, CTAB with normal respiratory effort MSK: extremities without edema, joints without erythema or swelling Neurologic:    Mental status is normal.  Gross motor and sensory exams are normal.  No tremor  Commons side effects, risks, benefits, and alternatives for medications and treatment plan prescribed today were discussed, and the patient expressed understanding of the given instructions. Patient is instructed to call or message via MyChart if he/she has any questions or concerns regarding our treatment plan. No barriers to understanding were identified. We discussed Red Flag symptoms and signs  in detail. Patient expressed understanding regarding what to do in case of urgent or emergency type symptoms.  Medication list was reconciled, printed and provided to the patient in AVS. Patient instructions and summary information was reviewed with the patient as documented in the AVS. This note was prepared with assistance of Dragon voice recognition software. Occasional wrong-word or sound-a-like substitutions may have occurred due to the inherent limitations of voice recognition software

## 2022-08-11 NOTE — Patient Instructions (Signed)
Please return in 12 months for your annual complete physical; please come fasting.  Your colonoscopy is due this year, please contact GI.   I will release your lab results to you on your MyChart account with further instructions. You may see the results before I do, but when I review them I will send you a message with my report or have my assistant call you if things need to be discussed. Please reply to my message with any questions. Thank you!   If you have any questions or concerns, please don't hesitate to send me a message via MyChart or call the office at [phone]. Thank you for visiting with [place] today! It's our pleasure caring for you.   Please call the office checked below to schedule your appointment for your mammogram and/or bone density screen (the checked studies were ordered): []   Mammogram  [x]   Bone Density  [x]   [facility]     [address] [place], [place]        [phone]         []   [facility]  [address] [place], [place]  [phone]

## 2022-08-16 ENCOUNTER — Other Ambulatory Visit: Payer: Self-pay | Admitting: Family Medicine

## 2022-08-16 DIAGNOSIS — Z780 Asymptomatic menopausal state: Secondary | ICD-10-CM

## 2023-03-06 ENCOUNTER — Ambulatory Visit
Admission: RE | Admit: 2023-03-06 | Discharge: 2023-03-06 | Disposition: A | Discharge status: Home / Self Care | Payer: Medicare Other | Source: Ambulatory Visit | Attending: Family Medicine | Admitting: Family Medicine

## 2023-03-06 DIAGNOSIS — Z780 Asymptomatic menopausal state: Secondary | ICD-10-CM

## 2023-06-12 ENCOUNTER — Other Ambulatory Visit: Payer: Self-pay | Admitting: Family Medicine

## 2023-06-12 DIAGNOSIS — Z1231 Encounter for screening mammogram for malignant neoplasm of breast: Secondary | ICD-10-CM

## 2023-07-17 ENCOUNTER — Ambulatory Visit
Admission: RE | Admit: 2023-07-17 | Discharge: 2023-07-17 | Disposition: A | Discharge status: Home / Self Care | Payer: Medicare Other | Source: Ambulatory Visit

## 2023-07-17 DIAGNOSIS — Z1231 Encounter for screening mammogram for malignant neoplasm of breast: Secondary | ICD-10-CM

## 2023-08-02 ENCOUNTER — Other Ambulatory Visit: Payer: Self-pay | Admitting: Family Medicine

## 2023-08-21 ENCOUNTER — Ambulatory Visit (INDEPENDENT_AMBULATORY_CARE_PROVIDER_SITE_OTHER): Admitting: Family Medicine

## 2023-08-21 ENCOUNTER — Ambulatory Visit: Admitting: Family Medicine

## 2023-08-21 VITALS — BP 112/78 | HR 61 | Temp 97.7°F | Ht 65.0 in | Wt 158.4 lb

## 2023-08-21 DIAGNOSIS — I10 Essential (primary) hypertension: Secondary | ICD-10-CM

## 2023-08-21 DIAGNOSIS — K635 Polyp of colon: Secondary | ICD-10-CM

## 2023-08-21 DIAGNOSIS — Z780 Asymptomatic menopausal state: Secondary | ICD-10-CM

## 2023-08-21 DIAGNOSIS — M7542 Impingement syndrome of left shoulder: Secondary | ICD-10-CM

## 2023-08-21 DIAGNOSIS — M8580 Other specified disorders of bone density and structure, unspecified site: Secondary | ICD-10-CM

## 2023-08-21 DIAGNOSIS — E782 Mixed hyperlipidemia: Secondary | ICD-10-CM | POA: Diagnosis not present

## 2023-08-21 DIAGNOSIS — E559 Vitamin D deficiency, unspecified: Secondary | ICD-10-CM | POA: Diagnosis not present

## 2023-08-21 LAB — CBC WITH DIFFERENTIAL/PLATELET
Basophils Absolute: 0.1 10*3/uL (ref 0.0–0.1)
Basophils Relative: 0.9 % (ref 0.0–3.0)
Eosinophils Absolute: 0.2 10*3/uL (ref 0.0–0.7)
Eosinophils Relative: 2.7 % (ref 0.0–5.0)
HCT: 42.6 % (ref 36.0–46.0)
Hemoglobin: 14.6 g/dL (ref 12.0–15.0)
Lymphocytes Relative: 39.4 % (ref 12.0–46.0)
Lymphs Abs: 2.3 10*3/uL (ref 0.7–4.0)
MCHC: 34.2 g/dL (ref 30.0–36.0)
MCV: 95.4 fl (ref 78.0–100.0)
Monocytes Absolute: 0.6 10*3/uL (ref 0.1–1.0)
Monocytes Relative: 9.6 % (ref 3.0–12.0)
Neutro Abs: 2.7 10*3/uL (ref 1.4–7.7)
Neutrophils Relative %: 47.4 % (ref 43.0–77.0)
Platelets: 276 10*3/uL (ref 150.0–400.0)
RBC: 4.46 Mil/uL (ref 3.87–5.11)
RDW: 13 % (ref 11.5–15.5)
WBC: 5.8 10*3/uL (ref 4.0–10.5)

## 2023-08-21 LAB — COMPREHENSIVE METABOLIC PANEL WITH GFR
ALT: 13 U/L (ref 0–35)
AST: 12 U/L (ref 0–37)
Albumin: 4.3 g/dL (ref 3.5–5.2)
Alkaline Phosphatase: 56 U/L (ref 39–117)
BUN: 18 mg/dL (ref 6–23)
CO2: 28 meq/L (ref 19–32)
Calcium: 9.8 mg/dL (ref 8.4–10.5)
Chloride: 103 meq/L (ref 96–112)
Creatinine, Ser: 0.83 mg/dL (ref 0.40–1.20)
GFR: 70.9 mL/min (ref >60.00)
Glucose, Bld: 107 mg/dL — ABNORMAL HIGH (ref 70–99)
Potassium: 4.3 meq/L (ref 3.5–5.1)
Sodium: 138 meq/L (ref 135–145)
Total Bilirubin: 0.8 mg/dL (ref 0.2–1.2)
Total Protein: 7.4 g/dL (ref 6.0–8.3)

## 2023-08-21 LAB — LIPID PANEL
Cholesterol: 230 mg/dL — ABNORMAL HIGH (ref 0–200)
HDL: 43.2 mg/dL (ref >39.00)
LDL Cholesterol: 148 mg/dL — ABNORMAL HIGH (ref 0–99)
NonHDL: 186.43
Total CHOL/HDL Ratio: 5
Triglycerides: 192 mg/dL — ABNORMAL HIGH (ref 0.0–149.0)
VLDL: 38.4 mg/dL (ref 0.0–40.0)

## 2023-08-21 LAB — TSH: TSH: 3.97 u[IU]/mL (ref 0.35–5.50)

## 2023-08-21 LAB — VITAMIN D 25 HYDROXY (VIT D DEFICIENCY, FRACTURES): VITD: 59.87 ng/mL (ref 30.00–100.00)

## 2023-08-21 MED ORDER — LISINOPRIL 5 MG PO TABS: 5.0000 mg | ORAL_TABLET | Freq: Every day | ORAL | 3 refills | Status: DC

## 2023-08-21 NOTE — Progress Notes (Signed)
 Subjective  Chief Complaint  Patient presents with   Annual Exam    Pt here for Annual Exam and is currently fasting    Hypertension    HPI: Stephanie Powell is a 73 y.o. female who presents to [facility] [place] today for a Female Wellness Visit. She also has the concerns and/or needs as listed above in the chief complaint. These will be addressed in addition to the Health Maintenance Visit.   Wellness Visit: annual visit with health maintenance review and exam  HM: overdue for colonoscopy. Was due last year for 3 year f/u surveillance for serrated polyp but pt did not receive reminder letter from GI. She thought she was due in 5 years. She will call office to verify. Mammo and dexa current. Has not had AWV; pt declines. Imms up to date. Healthy lifestyle. Exercises 3x/ week. Eats healthy Chronic disease f/u and/or acute problem visit: (deemed necessary to be done in addition to the wellness visit): Discussed the use of AI scribe software for clinical note transcription with the patient, who gave verbal consent to proceed.  History of Present Illness Stephanie Powell is a 73 year old female with hypertension, HLD, osteopenia and h/o serrated colon polyp who presents also with chronic arm pain.  She has been experiencing chronic arm pain for over a year, initially starting in the deltoid region and recently shifting to a different area of the arm. The pain occurs with certain movements, can be constant, especially at night, and is worsened by specific arm positions.  She maintains full range of motion but experiences pain when lifting heavy objects, feeling as though the arm might give out. Despite this, she can manage lighter weights during workouts. The pain is intermittent and sometimes interferes with her sleep. She has tried resting and using ice but has not taken any medication like Advil for the pain.  She exercises with a group two to three times a week but  has adjusted her activities to avoid exacerbating the pain. She has not relied on the affected arm during workouts when the pain is more pronounced. No issues with her elbow, wrist, or hand, and her shoulder is generally okay.  She has not pursued any other treatments beyond exercise and ice. She has started taking a black seed gummy supplement but is unsure of its effectiveness. She previously tried ginger and turmeric but discontinued them to avoid taking too many supplements.  HTN: well controlled on lisinopril  5. Feeling well. Taking medications w/o adverse effects. No symptoms of CHF, angina; no palpitations, sob, cp or lower extremity edema. Compliant with meds.   HLD: discussed again goals of primary prevention. Very hesitant ot start statin. Ascvd is mildly elevated. She works hard on diet and exercise. Would like to check to see where levels are today.   Reviewed osteopenia, stable by dexa 2024. Taking vitd and gets calcium  in diet. Weight bearing exercise.    Assessment  1. Essential hypertension   2. Mixed hyperlipidemia   3. Osteopenia after menopause   4. Serrated polyp of colon   5. Vitamin D  deficiency   6. Rotator cuff impingement syndrome of left shoulder      Plan  Female Wellness Visit: Age appropriate Health Maintenance and Prevention measures were discussed with patient. Included topics are cancer screening recommendations, ways to keep healthy (see AVS) including dietary and exercise recommendations, regular eye and dental care, use of seat belts, and avoidance of moderate alcohol  use and tobacco use. Will call GI to update colonoscopy if due. If not, will let me know and I will update her HM.  BMI: discussed patient's BMI and encouraged positive lifestyle modifications to help get to or maintain a target BMI. HM needs and immunizations were addressed and ordered. See below for orders. See HM and immunization section for updates. Routine labs and screening tests ordered  including cmp, cbc and lipids where appropriate. Discussed recommendations regarding Vit D and calcium  supplementation (see AVS)  Chronic disease management visit and/or acute problem visit: Assessment and Plan Assessment & Plan Rotator cuff impingement syndrome Chronic right arm pain consistent with rotator cuff impingement, likely due to mechanical issues and tendon inflammation. Conservative management appropriate. - Refer to physical therapy for shoulder exercises and stretching. - Recommend ibuprofen, two tablets in the morning and two at night, for one week as needed for pain. - Advise use of ice for joint pain relief. - Reassess in 3-6 weeks.  Hypertension Blood pressure well-controlled on current medication regimen. - Refilled Lisinopril . -Check renal function and electrolytes.   Hyperlipidemia Cholesterol levels improved with dietary changes, but cardiovascular risk remains slightly elevated. Discussed consideration of coronary calcium  score to assess risk and guide treatment. - Consider coronary calcium  score if cholesterol levels worsen. - Continue dietary modifications.  Osteopenia Bone density shows mild thinning. Vitamin D  and dietary calcium  intake are beneficial. - Continue vitamin D  supplementation. - Encourage dietary calcium  intake from low-fat sources. -recheck vit D levels on daily supplement.  I spent a total of 42 minutes for this patient encounter. Time spent included preparation, face-to-face counseling with the patient and coordination of care, review of chart and records, and documentation of the encounter.  Follow up:  12 mo for medicare cpe Orders Placed This Encounter  Procedures   VITAMIN D  25 Hydroxy (Vit-D Deficiency, Fractures)   CBC with Differential/Platelet   Comprehensive metabolic panel with GFR   Lipid panel   TSH   Ambulatory referral to Physical Therapy   Meds ordered this encounter  Medications   lisinopril  (ZESTRIL ) 5 MG tablet     Sig: Take 1 tablet (5 mg total) by mouth daily.    Dispense:  90 tablet    Refill:  3      Body mass index is 26.36 kg/m. Wt Readings from Last 3 Encounters:  08/21/23 158 lb 6.4 oz (71.8 kg)  08/11/22 160 lb 3.2 oz (72.7 kg)  11/28/21 155 lb 9.6 oz (70.6 kg)     Patient Active Problem List   Diagnosis Date Noted Date Diagnosed   Vitamin D  deficiency 08/21/2023    Mixed hyperlipidemia 05/31/2021     Offered statin 05/2021 and 2024, ASCVD risk 10.9%    Osteopenia after menopause 11/12/2019     [facility] DEXA 2020: T = -1.8 lowest DEXA 01/2020: T = -1.7; stable osteopenia. Recheck 2-3 years DEXA 2024: lowest T = -1.8; stable osteopenia. Receck 3 years.     Serrated polyp of colon 11/12/2019     Colonoscopy, [place]: 2015 Colonoscopy [place]: 2022, precancerous. Repeat in 3 years    Essential hypertension 11/04/2018    Health Maintenance  Topic Date Due   Medicare Annual Wellness (AWV)  Never done   Colonoscopy  01/09/2023   COVID-19 Vaccine (5 - 2024-25 season) 09/06/2023 (Originally 12/10/2022)   INFLUENZA VACCINE  11/09/2023   MAMMOGRAM  07/16/2024   DEXA SCAN  03/05/2026   Pneumonia Vaccine 86+ Years old  Completed   Hepatitis C  Screening  Completed   Zoster Vaccines- Shingrix   Completed   HPV VACCINES  Aged Out   Meningococcal B Vaccine  Aged Out   DTaP/Tdap/Td  Discontinued   Immunization History  Administered Date(s) Administered   Fluad Quad(high Dose 65+) 12/04/2018, 01/27/2021   Influenza-Unspecified 01/13/2018, 04/14/2022   Moderna Covid-19 Vaccine Bivalent Booster 31yrs & up 01/27/2021, 11/27/2021   Moderna Sars-Covid-2 Vaccination 05/13/2019, 06/12/2019   Pneumococcal Conjugate-13 09/05/2017   Pneumococcal Polysaccharide-23 12/04/2018   Zoster Recombinant(Shingrix ) 04/11/2019, 09/09/2019   We updated and reviewed the patient's past history in detail and it is documented below. Allergies: Patient is allergic to sulfa antibiotics. Past Medical  History Patient  has a past medical history of Allergy, Colon polyps (11/12/2019), Essential hypertension (11/04/2018), Hypertension, Osteopenia after menopause (11/12/2019), and Osteoporosis. Past Surgical History Patient  has a past surgical history that includes Knee orthosopy; Bunionectomy; and Tonsillectomy and adenoidectomy. Family History: Patient family history includes Colon polyps in her sister; Heart attack in her father; High Cholesterol in her mother; High blood pressure in her mother, sister, and sister; Stroke in her mother. Social History:  Patient  reports that she has never smoked. She has never used smokeless tobacco. She reports current alcohol use. She reports that she does not use drugs.  Review of Systems: Constitutional: negative for fever or malaise Ophthalmic: negative for photophobia, double vision or loss of vision Cardiovascular: negative for chest pain, dyspnea on exertion, or new LE swelling Respiratory: negative for SOB or persistent cough Gastrointestinal: negative for abdominal pain, change in bowel habits or melena Genitourinary: negative for dysuria or gross hematuria, no abnormal uterine bleeding or disharge Musculoskeletal: negative for new gait disturbance or muscular weakness Integumentary: negative for new or persistent rashes, no breast lumps Neurological: negative for TIA or stroke symptoms Psychiatric: negative for SI or delusions Allergic/Immunologic: negative for hives  Patient Care Team    Relationship Specialty Notifications Start End  Luevenia Saha, MD PCP - General Family Medicine  11/04/18   Annis Kinder, DO Consulting Physician Gastroenterology  05/25/21     Objective  Vitals: BP 112/78   Pulse 61   Temp 97.7 F (36.5 C)   Ht 5\' 5"  (1.651 m)   Wt 158 lb 6.4 oz (71.8 kg)   SpO2 97%   BMI 26.36 kg/m  General:  Well developed, well nourished, no acute distress  Psych:  Alert and orientedx3,normal mood and affect HEENT:   Normocephalic, atraumatic, non-icteric sclera,  supple neck without adenopathy, mass or thyromegaly Cardiovascular:  Normal S1, S2, RRR without gallop, rub or murmur Respiratory:  Good breath sounds bilaterally, CTAB with normal respiratory effort Gastrointestinal: normal bowel sounds, soft, non-tender, no noted masses. No HSM MSK: extremities without edema, joints without erythema or swelling Left shoulder: no ttp, FROM with pain with abduction + 90degrees, + empty can testing. Neg drop test Neurologic:    Mental status is normal.  Gross motor and sensory exams are normal.  No tremor  Commons side effects, risks, benefits, and alternatives for medications and treatment plan prescribed today were discussed, and the patient expressed understanding of the given instructions. Patient is instructed to call or message via MyChart if he/she has any questions or concerns regarding our treatment plan. No barriers to understanding were identified. We discussed Red Flag symptoms and signs in detail. Patient expressed understanding regarding what to do in case of urgent or emergency type symptoms.  Medication list was reconciled, printed and provided to the patient in AVS.  Patient instructions and summary information was reviewed with the patient as documented in the AVS. This note was prepared with assistance of Dragon voice recognition software. Occasional wrong-word or sound-a-like substitutions may have occurred due to the inherent limitations of voice recognition software

## 2023-08-21 NOTE — Patient Instructions (Addendum)
 Please return in 12 months for your annual medicare complete physical; please come fasting.   I will release your lab results to you on your MyChart account with further instructions. You may see the results before I do, but when I review them I will send you a message with my report or have my assistant call you if things need to be discussed. Please reply to my message with any questions. Thank you!   If you have any questions or concerns, please don't hesitate to send me a message via MyChart or call the office at [phone]. Thank you for visiting with us  today! It's our pleasure caring for you.    VISIT SUMMARY: During today's visit, we discussed your chronic arm pain, hypertension, cholesterol levels, and bone health. We reviewed your symptoms, current treatments, and made some adjustments to your care plan to help manage your conditions more effectively.  YOUR PLAN: -ROTATOR CUFF IMPINGEMENT SYNDROME: Rotator cuff impingement syndrome is a condition where the tendons of the shoulder become irritated and inflamed. We will start with conservative management, including physical therapy for shoulder exercises and stretching. You should take ibuprofen, two tablets in the morning and two at night, for one week as needed for pain. Continue using ice for joint pain relief. We will reassess your condition in 3-6 weeks.  -HYPERTENSION: Hypertension is high blood pressure. Your blood pressure is well-controlled with your current medication, Lisinopril , which has been refilled.  -HYPERLIPIDEMIA: Hyperlipidemia is having high levels of cholesterol in the blood. Your cholesterol levels have improved with dietary changes, but your cardiovascular risk is still slightly elevated. We discussed the possibility of a coronary calcium  score test if your cholesterol levels worsen. Continue with your dietary modifications.  -OSTEOPENIA: Osteopenia is a condition where bone density is lower than normal. Continue  taking your vitamin D  supplements and ensure you get enough dietary calcium  from low-fat sources.  INSTRUCTIONS: Please follow up in 3-6 weeks to reassess your arm pain and overall progress. Continue with the recommended treatments and lifestyle modifications. If you experience any new symptoms or have concerns, contact our office.                      Contains text generated by Abridge.                                 Contains text generated by Abridge.

## 2023-08-22 ENCOUNTER — Encounter: Payer: Self-pay | Admitting: Gastroenterology

## 2023-08-28 ENCOUNTER — Ambulatory Visit: Payer: Self-pay | Admitting: Family Medicine

## 2023-08-28 DIAGNOSIS — E782 Mixed hyperlipidemia: Secondary | ICD-10-CM

## 2023-08-28 MED ORDER — ROSUVASTATIN CALCIUM 5 MG PO TABS: 5.0000 mg | ORAL_TABLET | Freq: Every evening | ORAL | 3 refills | Status: AC

## 2023-08-28 NOTE — Progress Notes (Signed)
 Labs reviewed.  The 10-year ASCVD risk score (Arnett DK, et al., 2019) is: 11.7%   Values used to calculate the score:     Age: 73 years     Sex: Female     Is Non-Hispanic African American: No     Diabetic: No     Tobacco smoker: No     Systolic Blood Pressure: 112 mmHg     Is BP treated: Yes     HDL Cholesterol: 43.2 mg/dL     Total Cholesterol: 230 mg/dL  Dear Stephanie Powell, Thank you for allowing me to care for you at your recent office visit.  I wanted to let you know that I have reviewed your lab test results and am happy to report that they look good overall.  However, your fasting sugar is above normal; it should be < 100. Your level of 107 is in the early prediabetic range. If you were indeed fasting, then monitoring our diet and limiting sweets and processed carbohydrates is recommended. I recommend checking it again in 6 months.  Your cholesterol is also higher. Your cardiovascular disease risk score remains elevated and a statin is again encouraged, especially in the setting of a mildly elevated sugar test.  Please consider it and let me know if you'd like me to order it.  Please schedule a visit in 6 months.  Sincerely, Dr. Jonelle Neri

## 2023-09-10 ENCOUNTER — Ambulatory Visit: Admitting: Physical Therapy

## 2023-09-25 ENCOUNTER — Ambulatory Visit (AMBULATORY_SURGERY_CENTER)

## 2023-09-25 VITALS — Ht 65.0 in | Wt 160.0 lb

## 2023-09-25 DIAGNOSIS — Z860100 Personal history of colon polyps, unspecified: Secondary | ICD-10-CM

## 2023-09-25 NOTE — Progress Notes (Signed)
 No egg or soy allergy known to patient  No issues known to pt with past sedation with any surgeries or procedures Patient denies ever being told they had issues or difficulty with intubation  No FH of Malignant Hyperthermia Pt is not on diet pills Pt is not on  home 02  Pt is not on blood thinners  Pt denies issues with constipation  No A fib or A flutter Have any cardiac testing pending--no  LOA: independent Prep: spilt dose miralax   Patient's chart reviewed by Cathlyn Parsons CNRA prior to previsit and patient appropriate for the [facility].  Previsit completed and red dot placed by patient's name on their procedure day (on provider's schedule).     PV completed with patient. Prep instructions sent via mychart and home address.

## 2023-10-09 ENCOUNTER — Encounter: Payer: Self-pay | Admitting: Gastroenterology

## 2023-10-09 ENCOUNTER — Ambulatory Visit: Admitting: Gastroenterology

## 2023-10-09 VITALS — BP 99/68 | HR 72 | Temp 97.7°F | Resp 12 | Ht 65.0 in | Wt 160.0 lb

## 2023-10-09 DIAGNOSIS — D125 Benign neoplasm of sigmoid colon: Secondary | ICD-10-CM

## 2023-10-09 DIAGNOSIS — D123 Benign neoplasm of transverse colon: Secondary | ICD-10-CM

## 2023-10-09 DIAGNOSIS — K635 Polyp of colon: Secondary | ICD-10-CM

## 2023-10-09 DIAGNOSIS — K641 Second degree hemorrhoids: Secondary | ICD-10-CM

## 2023-10-09 DIAGNOSIS — Z860100 Personal history of colon polyps, unspecified: Secondary | ICD-10-CM

## 2023-10-09 DIAGNOSIS — K648 Other hemorrhoids: Secondary | ICD-10-CM | POA: Diagnosis not present

## 2023-10-09 DIAGNOSIS — Z1211 Encounter for screening for malignant neoplasm of colon: Secondary | ICD-10-CM

## 2023-10-09 DIAGNOSIS — K5730 Diverticulosis of large intestine without perforation or abscess without bleeding: Secondary | ICD-10-CM | POA: Diagnosis not present

## 2023-10-09 MED ORDER — SODIUM CHLORIDE 0.9 % IV SOLN: 500.0000 mL | INTRAVENOUS | Status: DC

## 2023-10-09 NOTE — Progress Notes (Signed)
 Pt's states no medical or surgical changes since previsit or office visit.

## 2023-10-09 NOTE — Patient Instructions (Addendum)
   Resume previous diet and medications. Repeat colonoscopy date to be determined based on pathology results.  Handouts provided on colon polyps, diverticulosis, and hemorrhoids.   YOU HAD AN ENDOSCOPIC PROCEDURE TODAY AT THE [facility]:   Refer to the procedure report that was given to you for any specific questions about what was found during the examination.  If the procedure report does not answer your questions, please call your gastroenterologist to clarify.  If you requested that your care partner not be given the details of your procedure findings, then the procedure report has been included in a sealed envelope for you to review at your convenience later.  YOU SHOULD EXPECT: Some feelings of bloating in the abdomen. Passage of more gas than usual.  Walking can help get rid of the air that was put into your GI tract during the procedure and reduce the bloating. If you had a lower endoscopy (such as a colonoscopy or flexible sigmoidoscopy) you may notice spotting of blood in your stool or on the toilet paper. If you underwent a bowel prep for your procedure, you may not have a normal bowel movement for a few days.  Please Note:  You might notice some irritation and congestion in your nose or some drainage.  This is from the oxygen used during your procedure.  There is no need for concern and it should clear up in a day or so.  SYMPTOMS TO REPORT IMMEDIATELY:  Following lower endoscopy (colonoscopy or flexible sigmoidoscopy):  Excessive amounts of blood in the stool  Significant tenderness or worsening of abdominal pains  Swelling of the abdomen that is new, acute  Fever of 100F or higher  For urgent or emergent issues, a gastroenterologist can be reached at any hour by calling [phone]. Do not use MyChart messaging for urgent concerns.    DIET:  We do recommend a small meal at first, but then you may proceed to your regular diet.  Drink plenty of fluids but you  should avoid alcoholic beverages for 24 hours.  ACTIVITY:  You should plan to take it easy for the rest of today and you should NOT DRIVE or use heavy machinery until tomorrow (because of the sedation medicines used during the test).    FOLLOW UP: Our staff will call the number listed on your records the next business day following your procedure.  We will call around 7:15- 8:00 am to check on you and address any questions or concerns that you may have regarding the information given to you following your procedure. If we do not reach you, we will leave a message.     If any biopsies were taken you will be contacted by phone or by letter within the next 1-3 weeks.  Please call us  at [phone] if you have not heard about the biopsies in 3 weeks.    SIGNATURES/CONFIDENTIALITY: You and/or your care partner have signed paperwork which will be entered into your electronic medical record.  These signatures attest to the fact that that the information above on your After Visit Summary has been reviewed and is understood.  Full responsibility of the confidentiality of this discharge information lies with you and/or your care-partner.

## 2023-10-09 NOTE — Progress Notes (Signed)
 Report to PACU, RN, vss, BBS= Clear.

## 2023-10-09 NOTE — Op Note (Signed)
  [facility] Patient Name: Stephanie Powell Procedure Date: 10/09/2023 11:34 AM MRN: [id number] Endoscopist: Sandor Flatter , MD, [id number] Age: 73 Referring MD:  Date of Birth: Aug 15, 1950 Gender: Female Account #: [id number] Procedure:                Colonoscopy Indications:              High risk colon cancer surveillance: Personal                            history of sessile serrated colon polyp (less than                            10 mm in size) with no dysplasia                           Last colonoscopy was 01/2020 and notable for 3                            polyps measuring 3-5 mm removed from the right                            colon, 8 mm transverse colon polyp, sigmoid                            diverticulosis, grade 2 internal hemorrhoids (all                            SSPs).                           Prior to that, colonoscopy in 02/2014 at outside                            facility notable for 2 mm tubular adenoma. Medicines:                Monitored Anesthesia Care Procedure:                Pre-Anesthesia Assessment:                           - Prior to the procedure, a History and Physical                            was performed, and patient medications and                            allergies were reviewed. The patient's tolerance of                            previous anesthesia was also reviewed. The risks                            and benefits of the procedure and the sedation  options and risks were discussed with the patient.                            All questions were answered, and informed consent                            was obtained. Prior Anticoagulants: The patient has                            taken no anticoagulant or antiplatelet agents. ASA                            Grade Assessment: II - A patient with mild systemic                            disease. After reviewing the risks and benefits,                             the patient was deemed in satisfactory condition to                            undergo the procedure.                           After obtaining informed consent, the colonoscope                            was passed under direct vision. Throughout the                            procedure, the patient's blood pressure, pulse, and                            oxygen saturations were monitored continuously. The                            CF [id number] #[id number] was introduced through the anus                            and advanced to the the cecum, identified by                            appendiceal orifice and ileocecal valve. The                            colonoscopy was performed without difficulty. The                            patient tolerated the procedure well. The quality                            of the bowel preparation was good. The ileocecal  valve, appendiceal orifice, and rectum were                            photographed. Scope In: 11:40:49 AM Scope Out: 11:56:10 AM Scope Withdrawal Time: 0 hours 11 minutes 23 seconds  Total Procedure Duration: 0 hours 15 minutes 21 seconds  Findings:                 The perianal and digital rectal examinations were                            normal.                           Two sessile polyps were found in the transverse                            colon. The polyps were 2 to 3 mm in size. These                            polyps were removed with a cold snare. Resection                            and retrieval were complete. Estimated blood loss                            was minimal.                           A 3 mm polyp was found in the sigmoid colon. The                            polyp was sessile. The polyp was removed with a                            cold snare. Resection and retrieval were complete.                            Estimated blood loss was minimal.                            Multiple small-mouthed diverticula were found in                            the sigmoid colon.                           Non-bleeding internal hemorrhoids were found during                            retroflexion. The hemorrhoids were small. Complications:            No immediate complications. Estimated Blood Loss:     Estimated blood loss was minimal. Impression:               - Two 2 to 3 mm polyps in  the transverse colon,                            removed with a cold snare. Resected and retrieved.                           - One 3 mm polyp in the sigmoid colon, removed with                            a cold snare. Resected and retrieved.                           - Diverticulosis in the sigmoid colon.                           - Non-bleeding internal hemorrhoids. Recommendation:           - Patient has a contact number available for                            emergencies. The signs and symptoms of potential                            delayed complications were discussed with the                            patient. Return to normal activities tomorrow.                            Written discharge instructions were provided to the                            patient.                           - Resume previous diet.                           - Continue present medications.                           - Await pathology results.                           - Repeat colonoscopy for surveillance based on                            pathology results.                           - Return to GI clinic PRN. Sandor Flatter, MD 10/09/2023 12:01:52 PM

## 2023-10-09 NOTE — Progress Notes (Signed)
 Called to room to assist during endoscopic procedure.  Patient ID and intended procedure confirmed with present staff. Received instructions for my participation in the procedure from the performing physician.

## 2023-10-09 NOTE — Progress Notes (Signed)
 GASTROENTEROLOGY PROCEDURE H&P NOTE   Primary Care Physician: Jodie Lavern CROME, MD    Reason for Procedure:  Colon polyp surveillance  Plan:    Colonoscopy  Patient is appropriate for endoscopic procedure(s) in the ambulatory ([facility]) setting.  The nature of the procedure, as well as the risks, benefits, and alternatives were carefully and thoroughly reviewed with the patient. Ample time for discussion and questions allowed. The patient understood, was satisfied, and agreed to proceed.     HPI: Stephanie Powell is a 73 y.o. female who presents for colonoscopy for ongoing colon polyp surveillance and colon cancer screening.  No active GI symptoms.  No known family history of colon cancer or related malignancy.  Patient is otherwise without complaints or active issues today.  Last colonoscopy was 01/2020 and notable for 3 polyps measuring 3-5 mm removed from the right colon, 8 mm transverse colon polyp, sigmoid diverticulosis, grade 2 internal hemorrhoids (all SSPs).   Prior to that, colonoscopy in 02/2014 at outside facility notable for 2 mm tubular adenoma.  Past Medical History:  Diagnosis Date   Allergy    seasonal   Colon polyps 11/12/2019   Colonoscopy, [place]: 2015   Essential hypertension 11/04/2018   Hypertension    Osteopenia after menopause 11/12/2019   [facility] DEXA 2020: T = -1.8 lowest   Osteoporosis     Past Surgical History:  Procedure Laterality Date   BUNIONECTOMY     Knee orthosopy     TONSILLECTOMY AND ADENOIDECTOMY      Prior to Admission medications   Medication Sig Start Date End Date Taking? Authorizing Provider  lisinopril  (ZESTRIL ) 5 MG tablet Take 1 tablet (5 mg total) by mouth daily. 08/21/23  Yes Jodie Lavern CROME, MD  rosuvastatin  (CRESTOR ) 5 MG tablet Take 1 tablet (5 mg total) by mouth at bedtime. 08/28/23  Yes Jodie Lavern CROME, MD    Current Outpatient Medications  Medication Sig Dispense Refill   lisinopril  (ZESTRIL ) 5 MG tablet Take 1  tablet (5 mg total) by mouth daily. 90 tablet 3   rosuvastatin  (CRESTOR ) 5 MG tablet Take 1 tablet (5 mg total) by mouth at bedtime. 90 tablet 3   Current Facility-Administered Medications  Medication Dose Route Frequency Provider Last Rate Last Admin   0.9 %  sodium chloride  infusion  500 mL Intravenous Continuous Mclain Freer V, DO        Allergies as of 10/09/2023 - Review Complete 10/09/2023  Allergen Reaction Noted   Sulfa antibiotics Nausea And Vomiting 11/04/2018    Family History  Problem Relation Age of Onset   High blood pressure Mother    High Cholesterol Mother    Stroke Mother    Heart attack Father    High blood pressure Sister    Colon polyps Sister    High blood pressure Sister    Colon cancer Neg Hx    Esophageal cancer Neg Hx    Rectal cancer Neg Hx    Stomach cancer Neg Hx    Breast cancer Neg Hx     Social History   Socioeconomic History   Marital status: Single    Spouse name: Not on file   Number of children: Not on file   Years of education: Not on file   Highest education level: Bachelor's degree (e.g., BA, AB, BS)  Occupational History   Not on file  Tobacco Use   Smoking status: Never   Smokeless tobacco: Never  Vaping Use   Vaping status:  Never Used  Substance and Sexual Activity   Alcohol use: Yes   Drug use: Never   Sexual activity: Not Currently  Other Topics Concern   Not on file  Social History Narrative   Not on file   Social Drivers of Health   Financial Resource Strain: Low Risk  (08/21/2023)   Overall Financial Resource Strain (CARDIA)    Difficulty of Paying Living Expenses: Not very hard  Food Insecurity: No Food Insecurity (08/21/2023)   Hunger Vital Sign    Worried About Running Out of Food in the Last Year: Never true    Ran Out of Food in the Last Year: Never true  Transportation Needs: No Transportation Needs (08/21/2023)   PRAPARE - Administrator, Civil Service (Medical): No    Lack of  Transportation (Non-Medical): No  Physical Activity: Insufficiently Active (08/21/2023)   Exercise Vital Sign    Days of Exercise per Week: 3 days    Minutes of Exercise per Session: 40 min  Stress: No Stress Concern Present (08/21/2023)   [employer] of Occupational Health - Occupational Stress Questionnaire    Feeling of Stress : Only a little  Social Connections: Moderately Isolated (08/21/2023)   Social Connection and Isolation Panel    Frequency of Communication with Friends and Family: More than three times a week    Frequency of Social Gatherings with Friends and Family: More than three times a week    Attends Religious Services: Never    Database administrator or Organizations: Yes    Attends Engineer, structural: More than 4 times per year    Marital Status: Divorced  Catering manager Violence: Not on file    Physical Exam: Vital signs in last 24 hours: @BP  (!) 142/79   Pulse 70   Temp 97.7 F (36.5 C)   Ht 5' 5 (1.651 m)   Wt 160 lb (72.6 kg)   SpO2 96%   BMI 26.63 kg/m  GEN: NAD EYE: Sclerae anicteric ENT: MMM CV: Non-tachycardic Pulm: CTA b/l GI: Soft, NT/ND NEURO:  Alert & Oriented x 3   Sandor Flatter, DO [facility]   10/09/2023 11:30 AM

## 2023-10-10 ENCOUNTER — Telehealth: Payer: Self-pay

## 2023-10-10 NOTE — Telephone Encounter (Signed)
  Follow up Call-     10/09/2023   11:09 AM  Call back number  Post procedure Call Back phone  # [phone]  Permission to leave phone message Yes     Patient questions:  Do you have a fever, pain , or abdominal swelling? No. Pain Score  0 *  Have you tolerated food without any problems? Yes.    Have you been able to return to your normal activities? Yes.    Do you have any questions about your discharge instructions: Diet   No. Medications  No. Follow up visit  No.  Do you have questions or concerns about your Care? No.  Actions: * If pain score is 4 or above: No action needed, pain <4.

## 2023-10-15 LAB — SURGICAL PATHOLOGY

## 2023-10-16 ENCOUNTER — Ambulatory Visit: Payer: Self-pay | Admitting: Gastroenterology

## 2024-03-26 ENCOUNTER — Ambulatory Visit: Admitting: Family Medicine

## 2024-03-26 ENCOUNTER — Ambulatory Visit: Payer: Self-pay | Admitting: Family Medicine

## 2024-03-26 ENCOUNTER — Encounter: Payer: Self-pay | Admitting: Family Medicine

## 2024-03-26 VITALS — BP 110/78 | HR 69 | Temp 97.2°F | Ht 65.0 in | Wt 168.4 lb

## 2024-03-26 DIAGNOSIS — J301 Allergic rhinitis due to pollen: Secondary | ICD-10-CM | POA: Insufficient documentation

## 2024-03-26 DIAGNOSIS — E782 Mixed hyperlipidemia: Secondary | ICD-10-CM | POA: Diagnosis not present

## 2024-03-26 DIAGNOSIS — I10 Essential (primary) hypertension: Secondary | ICD-10-CM

## 2024-03-26 DIAGNOSIS — M47812 Spondylosis without myelopathy or radiculopathy, cervical region: Secondary | ICD-10-CM | POA: Insufficient documentation

## 2024-03-26 DIAGNOSIS — R42 Dizziness and giddiness: Secondary | ICD-10-CM | POA: Insufficient documentation

## 2024-03-26 DIAGNOSIS — R7301 Impaired fasting glucose: Secondary | ICD-10-CM | POA: Diagnosis not present

## 2024-03-26 LAB — COMPREHENSIVE METABOLIC PANEL WITH GFR
ALT: 15 U/L (ref 3–35)
AST: 16 U/L (ref 5–37)
Albumin: 4.5 g/dL (ref 3.5–5.2)
Alkaline Phosphatase: 66 U/L (ref 39–117)
BUN: 18 mg/dL (ref 6–23)
CO2: 30 meq/L (ref 19–32)
Calcium: 10.1 mg/dL (ref 8.4–10.5)
Chloride: 102 meq/L (ref 96–112)
Creatinine, Ser: 0.88 mg/dL (ref 0.40–1.20)
GFR: 65.81 mL/min (ref >60.00)
Glucose, Bld: 106 mg/dL — ABNORMAL HIGH (ref 70–99)
Potassium: 4.4 meq/L (ref 3.5–5.1)
Sodium: 139 meq/L (ref 135–145)
Total Bilirubin: 0.9 mg/dL (ref 0.2–1.2)
Total Protein: 7.4 g/dL (ref 6.0–8.3)

## 2024-03-26 LAB — LIPID PANEL
Cholesterol: 133 mg/dL (ref 28–200)
HDL: 41.7 mg/dL (ref >39.00)
LDL Cholesterol: 67 mg/dL (ref 10–99)
NonHDL: 91.63
Total CHOL/HDL Ratio: 3
Triglycerides: 123 mg/dL (ref 10.0–149.0)
VLDL: 24.6 mg/dL (ref 0.0–40.0)

## 2024-03-26 LAB — HEMOGLOBIN A1C: Hgb A1c MFr Bld: 6 % (ref 4.6–6.5)

## 2024-03-26 MED ORDER — LISINOPRIL 10 MG PO TABS: 10.0000 mg | ORAL_TABLET | Freq: Every day | ORAL | 3 refills | Status: AC

## 2024-03-26 MED ORDER — MECLIZINE HCL 25 MG PO TABS: 25.0000 mg | ORAL_TABLET | Freq: Three times a day (TID) | ORAL | 0 refills | Status: AC | PRN

## 2024-03-26 NOTE — Progress Notes (Signed)
 Subjective  CC:  Chief Complaint  Patient presents with   Hypertension    Here for month follow-up for blood pressure and lab draw. Has fasted for labs. She has doubled her BP meds. She as also been tracking her BP's for the last month for provider.     HPI: Stephanie Powell is a 73 y.o. female who presents to the office today to address the problems listed above in the chief complaint. Hypertension f/u:  Discussed the use of AI scribe software for clinical note transcription with the patient, who gave verbal consent to proceed.  History of Present Illness Stephanie Powell is a 73 year old female with hypertension who presents with headaches and dizziness.  Headache - Persistent headaches for approximately one month - Primarily located at the base of the neck and head - present upon waking in the morning - Present most of the time, with slight improvement over the past few days - No associated nausea, vomiting, double vision, or photophobia - Occasional throat clearing and congestion - No significant drainage or current allergy symptoms - History of sinus problems, especially when living in [place] - Uses Tylenol for headache and sinus relief - endorses more neck pain overall as well  Dizziness and lightheadedness w/ h/o BPVV - Dizziness and lightheadedness began about a month ago - has had vertigo but also some lightheadedness - No associated nausea or vomiting - Symptoms improved after increasing lisinopril  dosage, but some lightheadedness persists - Has used Antivert  in the past for vertigo symptoms - no neuro sxs  Hypertension and antihypertensive therapy - started taking bp: readings were high: 140s/80s-90. - Recently increased lisinopril  dosage from 5 mg to 10 mg daily due to high blood pressure readings and associated symptoms - Blood pressure has responded well to increased dosage - Some lightheadedness persists despite improved blood pressure - Cautious with  medication use HLD started crestor  and tolerating it. Due for recheck fasting  IFG: no sxs of hyperglyecemia. Due for recheck  Neck pain - Constant aching neck pain - No recent neck x-ray  Psychosocial stressors - Recent stress related to sister's multiple hospitalizations and cardioversion for atrial fibrillation - Feels she has coped well - No mood disturbances reported  H/o allergies treated with oral antihistamines in the past.    Assessment  1. Essential hypertension   2. Mixed hyperlipidemia   3. IFG (impaired fasting glucose)   4. Chronic vertigo   5. Spondylosis of cervical region without myelopathy or radiculopathy   6. Seasonal allergic rhinitis due to pollen      Plan  Assessment and Plan Assessment & Plan Essential hypertension Blood pressure has improved with increased lisinopril  dosage. Lightheadedness likely due to low blood pressure rather than hypertension. Blood pressure readings have been variable but generally improved. - Continue lisinopril  at 10 mg daily  Mixed hyperlipidemia Currently tolerating rosuvastatin  with minimal side effects.  - Continue rosuvastatin  - Ordered blood work to check cholesterol levels, fasting lipids w/ lfts  Impaired fasting glucose Monitoring glucose levels as part of routine health maintenance. - Ordered blood work to check A1c and glucose levels  Cervical arthritis with neck pain and tension headache Chronic neck pain likely due to cervical arthritis, contributing to tension headaches. Multifactorial etiology including tension headaches, neck pain, and possible allergies. - deferred x-ray of the neck - Recommended Tylenol for neck pain and headaches - Advised use of heating pad and massage for neck pain - Encouraged relaxation techniques such as  meditation  Allergic rhinitis Intermittent congestion and sinus pressure, possibly related to allergies. No significant drainage or sneezing. - Start Zyrtec or Allegra for  allergy symptoms - Consider nasal spray if symptoms persist  Vertigo and lightheadedness: likely multifactorial.  Intermittent vertigo with lightheadedness, possibly related to cervical arthritis or blood pressure fluctuations. Symptoms include dizziness and lightheadedness, particularly with head movements. - Refilled Antivert  for vertigo management  General health maintenance Routine health maintenance discussed, including regular eye exams and physical activity. - Encouraged regular eye exams - Continue regular physical activity, including weight lifting   Education regarding management of these chronic disease states was given. Management strategies discussed on successive visits include dietary and exercise recommendations, goals of achieving and maintaining IBW, and lifestyle modifications aiming for adequate sleep and minimizing stressors.  Follow up: 6 mo for cpe.  Orders Placed This Encounter  Procedures   Lipid panel   Comprehensive metabolic panel with GFR   Hemoglobin A1c   Meds ordered this encounter  Medications   meclizine  (ANTIVERT ) 25 MG tablet    Sig: Take 1 tablet (25 mg total) by mouth 3 (three) times daily as needed for dizziness.    Dispense:  30 tablet    Refill:  0   lisinopril  (ZESTRIL ) 10 MG tablet    Sig: Take 1 tablet (10 mg total) by mouth daily.    Dispense:  90 tablet    Refill:  3      BP Readings from Last 3 Encounters:  03/26/24 110/78  10/09/23 99/68  08/21/23 112/78   Wt Readings from Last 3 Encounters:  03/26/24 168 lb 6.4 oz (76.4 kg)  10/09/23 160 lb (72.6 kg)  09/25/23 160 lb (72.6 kg)    Lab Results  Component Value Date   CHOL 230 (H) 08/21/2023   CHOL 190 08/11/2022   CHOL 199 11/28/2021   Lab Results  Component Value Date   HDL 43.20 08/21/2023   HDL 40.40 08/11/2022   HDL 39.20 11/28/2021   Lab Results  Component Value Date   LDLCALC 148 (H) 08/21/2023   LDLCALC 126 (H) 08/11/2022   LDLCALC 140 (H) 11/28/2021    Lab Results  Component Value Date   TRIG 192.0 (H) 08/21/2023   TRIG 119.0 08/11/2022   TRIG 99.0 11/28/2021   Lab Results  Component Value Date   CHOLHDL 5 08/21/2023   CHOLHDL 5 08/11/2022   CHOLHDL 5 11/28/2021   Lab Results  Component Value Date   LDLDIRECT 142.0 05/25/2021   Lab Results  Component Value Date   CREATININE 0.83 08/21/2023   BUN 18 08/21/2023   NA 138 08/21/2023   K 4.3 08/21/2023   CL 103 08/21/2023   CO2 28 08/21/2023    The 10-year ASCVD risk score (Arnett DK, et al., 2019) is: 12.3%   Values used to calculate the score:     Age: 27 years     Clinically relevant sex: Female     Is Non-Hispanic African American: No     Diabetic: No     Tobacco smoker: No     Systolic Blood Pressure: 110 mmHg     Is BP treated: Yes     HDL Cholesterol: 43.2 mg/dL     Total Cholesterol: 230 mg/dL  I reviewed the patients updated PMH, FH, and SocHx.    Patient Active Problem List   Diagnosis Date Noted   Chronic vertigo 03/26/2024   Cervical osteoarthritis 03/26/2024   Seasonal allergic rhinitis due to pollen  03/26/2024   Vitamin D  deficiency 08/21/2023   Mixed hyperlipidemia 05/31/2021   Osteopenia after menopause 11/12/2019   Serrated polyp of colon 11/12/2019   Essential hypertension 11/04/2018    Allergies: Sulfa antibiotics  Social History: Patient  reports that she has never smoked. She has never used smokeless tobacco. She reports current alcohol use. She reports that she does not use drugs.  Active Medications[1]  Review of Systems: Cardiovascular: negative for chest pain, palpitations, leg swelling, orthopnea Respiratory: negative for SOB, wheezing or persistent cough Gastrointestinal: negative for abdominal pain Genitourinary: negative for dysuria or gross hematuria  Objective  Vitals: BP 110/78   Pulse 69   Temp (!) 97.2 F (36.2 C) (Temporal)   Ht 5' 5 (1.651 m)   Wt 168 lb 6.4 oz (76.4 kg)   SpO2 96%   BMI 28.02 kg/m   General: no acute distress  Psych:  Alert and oriented, normal mood and affect HEENT:  Normocephalic, atraumatic, supple neck  Cardiovascular:  RRR without murmur. no edema Respiratory:  Good breath sounds bilaterally, CTAB with normal respiratory effort Skin:  Warm, no rashes Neurologic:   Mental status is normal Commons side effects, risks, benefits, and alternatives for medications and treatment plan prescribed today were discussed, and the patient expressed understanding of the given instructions. Patient is instructed to call or message via MyChart if he/she has any questions or concerns regarding our treatment plan. No barriers to understanding were identified. We discussed Red Flag symptoms and signs in detail. Patient expressed understanding regarding what to do in case of urgent or emergency type symptoms.  Medication list was reconciled, printed and provided to the patient in AVS. Patient instructions and summary information was reviewed with the patient as documented in the AVS. This note was prepared with assistance of Dragon voice recognition software. Occasional wrong-word or sound-a-like substitutions may have occurred due to the inherent limitation    [1]  Current Meds  Medication Sig   lisinopril  (ZESTRIL ) 10 MG tablet Take 1 tablet (10 mg total) by mouth daily.   meclizine  (ANTIVERT ) 25 MG tablet Take 1 tablet (25 mg total) by mouth 3 (three) times daily as needed for dizziness.   rosuvastatin  (CRESTOR ) 5 MG tablet Take 1 tablet (5 mg total) by mouth at bedtime.   [DISCONTINUED] lisinopril  (ZESTRIL ) 5 MG tablet Take 1 tablet (5 mg total) by mouth daily. (Patient taking differently: Take 5 mg by mouth daily. Taking 10mg  daily)

## 2024-03-26 NOTE — Patient Instructions (Signed)
 Please return in 6 months for your annual complete physical; please come fasting.  For follow up on chronic medical conditions   I will release your lab results to you on your MyChart account with further instructions. You may see the results before I do, but when I review them I will send you a message with my report or have my assistant call you if things need to be discussed. Please reply to my message with any questions. Thank you!   If you have any questions or concerns, please don't hesitate to send me a message via MyChart or call the office at [phone]. Thank you for visiting with us  today! It's our pleasure caring for you.    VISIT SUMMARY: Today, we discussed your persistent headaches, dizziness, and overall health management. We reviewed your current medications and made some adjustments to better manage your symptoms. We also discussed your recent stressors and how you are coping with them.  YOUR PLAN: -ESSENTIAL HYPERTENSION: Your blood pressure has improved with the increased dosage of lisinopril . Lightheadedness may be due to low blood pressure. Continue taking lisinopril  at 10 mg daily.  -MIXED HYPERLIPIDEMIA: Your cholesterol levels are being managed with rosuvastatin , which you are tolerating well. We have ordered blood work to check your cholesterol levels.  -IMPAIRED FASTING GLUCOSE: We are monitoring your glucose levels as part of routine health maintenance. Blood work has been ordered to check your A1c and glucose levels.  -CERVICAL ARTHRITIS WITH NECK PAIN AND TENSION HEADACHE: Your chronic neck pain is likely due to cervical arthritis, which is also contributing to your tension headaches. We have ordered an x-ray of your neck and recommend using Tylenol, a heating pad, and massage for relief. Relaxation techniques like meditation may also help.  -ALLERGIC RHINITIS: Your intermittent congestion and sinus pressure may be related to allergies. Start taking Zyrtec or  Allegra, and consider a nasal spray if symptoms persist.  -VERTIGO: Your dizziness and lightheadedness may be related to cervical arthritis or blood pressure changes. We have refilled your prescription for Antivert  to help manage these symptoms.  -GENERAL HEALTH MAINTENANCE: We discussed the importance of regular eye exams and maintaining physical activity, including weight lifting, as part of your routine health maintenance.  INSTRUCTIONS: Please follow up with the ordered blood work and x-ray. Continue taking your medications as prescribed. Schedule regular eye exams and maintain your physical activity routine. If your symptoms persist or worsen, please contact our office.                      Contains text generated by [employer]bridge.                                 Contains text generated by [employer]bridge.

## 2024-03-26 NOTE — Progress Notes (Signed)
 See mychart note Dear Ms. Lok, Your cholesterol now looks great! Your sugar test remains mildly elevated; minimize sugars and sweets and processed carbohydrates. I will monitor this for you.  Sincerely, Dr. Jodie
